# Patient Record
Sex: Female | Born: 2000 | Race: White | Hispanic: No | Marital: Single | Smoking: Never smoker
Health system: Southern US, Community
[De-identification: ages and names within clinical notes are randomized; demographics above are authoritative.]

## PROBLEM LIST (undated history)

## (undated) DIAGNOSIS — G43109 Migraine with aura, not intractable, without status migrainosus: Secondary | ICD-10-CM

## (undated) DIAGNOSIS — G43909 Migraine, unspecified, not intractable, without status migrainosus: Secondary | ICD-10-CM

## (undated) HISTORY — DX: Migraine with aura, not intractable, without status migrainosus: G43.109

## (undated) HISTORY — DX: Migraine, unspecified, not intractable, without status migrainosus: G43.909

---
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
## 2021-03-04 DIAGNOSIS — G950 Syringomyelia and syringobulbia: Secondary | ICD-10-CM | POA: Insufficient documentation

## 2021-03-04 DIAGNOSIS — M542 Cervicalgia: Secondary | ICD-10-CM | POA: Insufficient documentation

## 2021-03-04 DIAGNOSIS — G8929 Other chronic pain: Secondary | ICD-10-CM | POA: Insufficient documentation

## 2021-05-23 ENCOUNTER — Other Ambulatory Visit: Payer: Self-pay

## 2021-05-23 ENCOUNTER — Emergency Department (HOSPITAL_BASED_OUTPATIENT_CLINIC_OR_DEPARTMENT_OTHER)
Admission: EM | Admit: 2021-05-23 | Discharge: 2021-05-23 | Disposition: A | Discharge status: Home / Self Care | Payer: Medicaid Other | Attending: Emergency Medicine | Admitting: Emergency Medicine

## 2021-05-23 ENCOUNTER — Encounter (HOSPITAL_BASED_OUTPATIENT_CLINIC_OR_DEPARTMENT_OTHER): Payer: Self-pay | Admitting: Emergency Medicine

## 2021-05-23 DIAGNOSIS — R0602 Shortness of breath: Secondary | ICD-10-CM | POA: Diagnosis not present

## 2021-05-23 DIAGNOSIS — R42 Dizziness and giddiness: Secondary | ICD-10-CM | POA: Diagnosis not present

## 2021-05-23 DIAGNOSIS — Z20822 Contact with and (suspected) exposure to covid-19: Secondary | ICD-10-CM | POA: Diagnosis not present

## 2021-05-23 DIAGNOSIS — R0981 Nasal congestion: Secondary | ICD-10-CM

## 2021-05-23 LAB — RESP PANEL BY RT-PCR (FLU A&B, COVID) ARPGX2
Influenza A by PCR: NEGATIVE
Influenza B by PCR: NEGATIVE
SARS Coronavirus 2 by RT PCR: NEGATIVE

## 2021-05-23 MED ORDER — OXYMETAZOLINE HCL 0.05 % NA SOLN: 1.0000 | Freq: Two times a day (BID) | NASAL | 0 refills | Status: DC

## 2021-05-23 NOTE — Discharge Instructions (Addendum)
Please use Afrin for the next 3 days.  Do not use Afrin after 3 days.  Please start taking fluticasone nasal spray and cetirizine antihistamine daily for allergies and nasal congestion.  Please keep your appointment with ENT.  You can view your results of the respiratory panel on MyChart.  Please return to the emergency department for worsening symptoms.

## 2021-05-23 NOTE — ED Triage Notes (Signed)
Pt presents with congestion to nose, cant breathe out of her nose. Tried flonase without relief. Pt states "ive been having sinus issues for awhile."No antibiotic use.

## 2021-05-23 NOTE — ED Notes (Signed)
EMT-P provided AVS using Teachback Method. Patient verbalizes understanding of Discharge Instructions. Opportunity for Questioning and Answers were provided by EMT-P. Patient Discharged from ED.  ? ?

## 2021-05-23 NOTE — ED Provider Notes (Signed)
[facility] EMERGENCY DEPT Provider Note   CSN: [id number] Arrival date & time: 05/23/21  1121     History Chief Complaint  Lori Wolf presents with   Shortness of Breath    Lori Wolf is a 21 y.o. female who presents to the emergency department for evaluation of nasal congestion that has been ongoing for years.  Lori Wolf states that this morning Lori Wolf woke up around 3 AM cannot breathe out of Lori Wolf nose which caused Lori Wolf to have profound shortness of breath.  Lori Wolf then took a shower and did some Flonase which briefly improved.  Lori Wolf then got congested again and felt like Lori Wolf is getting short of breath as well and had some dizziness and lightheadedness.  Lori Wolf did not pass out.  No chest pain or shortness of breath of this episode.  Lori Wolf does have an ENT appointment scheduled for next week but wanted to come get evaluated.  Lori Wolf does not have any sore throat, fever, chills, cough, or shortness of breath currently.  Lori Wolf states Lori Wolf is able to breathe out of Lori Wolf mouth normally.   Shortness of Breath     Home Medications Prior to Admission medications   Medication Sig Start Date End Date Taking? Authorizing Provider  amphetamine-dextroamphetamine (ADDERALL) 10 MG tablet Take 10 mg by mouth daily with breakfast.   Yes [provider]  FLUoxetine (PROZAC) 20 MG tablet Take 30 mg by mouth daily.   Yes [provider]  oxymetazoline (AFRIN NASAL SPRAY) 0.05 % nasal spray Place 1 spray into both nostrils 2 (two) times daily. 05/23/21  Yes Meredeth Ide, Ioane Bhola M, PA-C  propranolol (INDERAL) 20 MG tablet Take by mouth 2 (two) times daily.  05/19/19  Yes [provider]  sertraline (ZOLOFT) 50 MG tablet Take 75 mg by mouth daily. 06/11/19  Yes [provider]      Allergies    Lori Wolf has no known allergies.    Review of Systems   Review of Systems  Respiratory:  Positive for shortness of breath.   All other systems reviewed and are negative.  Physical  Exam Updated Vital Signs BP 113/73    Pulse 76    Temp 97.6 F (36.4 C) (Oral)    Resp 12    SpO2 100%  Physical Exam Vitals and nursing note reviewed.  Constitutional:      Appearance: Normal appearance.  HENT:     Head: Normocephalic and atraumatic.     Right Ear: Tympanic membrane, ear canal and external ear normal.     Left Ear: Tympanic membrane, ear canal and external ear normal.     Nose:     Right Turbinates: Enlarged and swollen.     Left Turbinates: Enlarged and swollen.     Right Sinus: No maxillary sinus tenderness or frontal sinus tenderness.     Left Sinus: No maxillary sinus tenderness or frontal sinus tenderness.     Comments: No evidence of purulent drainage from either nasal cavity.    Mouth/Throat:     Mouth: Mucous membranes are moist.     Pharynx: Oropharynx is clear. No pharyngeal swelling or oropharyngeal exudate.     Tonsils: No tonsillar exudate or tonsillar abscesses.  Eyes:     General:        Right eye: No discharge.        Left eye: No discharge.     Conjunctiva/sclera: Conjunctivae normal.  Pulmonary:     Effort: Pulmonary effort is normal.  Skin:  General: Skin is warm and dry.     Findings: No rash.  Neurological:     General: No focal deficit present.     Mental Status: Lori Wolf is alert.  Psychiatric:        Mood and Affect: Mood normal.        Behavior: Behavior normal.    ED Results / Procedures / Treatments   Labs (all labs ordered are listed, but only abnormal results are displayed) Labs Reviewed  RESP PANEL BY RT-PCR (FLU A&B, COVID) ARPGX2    EKG None  Radiology No results found.  Procedures Procedures    Medications Ordered in ED Medications - No data to display  ED Course/ Medical Decision Making/ A&P                           Medical Decision Making  Katiana Ruland is a 21 y.o. female who presents the emergency department with nasal congestion and shortness of breath.  Clinically, Lori Wolf is resting comfortably  in no acute distress.  Lori Wolf was able to answer all my questions in complete sentences without being short of breath.  There is evidence of bilateral edema and erythema over the turbinates and the nose.  Lori Wolf does not take any antihistamines or intranasal steroids on a regular basis.  I will plan to get a COVID and influenza swab to evaluate this is not a viral infection.  I do not think this is bacterial in nature as Lori Wolf is not having purulent drainage or sinus tenderness.  I doubt cardiopulmonary causes at this time.  I will plan to write Lori Wolf prescription for Afrin that Lori Wolf can use for the next 3 days and have Lori Wolf start Zyrtec and fluticasone daily until Lori Wolf follows up with ENT.  Lori Wolf wishes to go home.  Respiratory panel still pending.  I do have a low suspicion this is viral at this time.  They can view the results on MyChart.  They are amendable to this plan.  I instructed them to keep their appointment with ENT and follow-up with them.  Strict turn precautions were given.  Lori Wolf is safe for discharge.  Final Clinical Impression(s) / ED Diagnoses Final diagnoses:  Nasal congestion    Rx / DC Orders ED Discharge Orders          Ordered    oxymetazoline (AFRIN NASAL SPRAY) 0.05 % nasal spray  2 times daily        05/23/21 1301              Honor Loh [place], [place] 05/23/21 1302    Franne Forts, DO 05/24/21 2112

## 2021-06-24 ENCOUNTER — Encounter: Payer: Self-pay | Admitting: *Deleted

## 2021-06-24 ENCOUNTER — Other Ambulatory Visit: Payer: Self-pay | Admitting: *Deleted

## 2021-06-26 ENCOUNTER — Ambulatory Visit (INDEPENDENT_AMBULATORY_CARE_PROVIDER_SITE_OTHER): Payer: Medicaid Other | Admitting: Psychiatry

## 2021-06-26 ENCOUNTER — Encounter: Payer: Self-pay | Admitting: Psychiatry

## 2021-06-26 VITALS — BP 132/82 | HR 104 | Ht 64.0 in | Wt 159.4 lb

## 2021-06-26 DIAGNOSIS — G950 Syringomyelia and syringobulbia: Secondary | ICD-10-CM

## 2021-06-26 DIAGNOSIS — G43709 Chronic migraine without aura, not intractable, without status migrainosus: Secondary | ICD-10-CM | POA: Diagnosis not present

## 2021-06-26 MED ORDER — GABAPENTIN 300 MG PO CAPS: ORAL_CAPSULE | ORAL | 3 refills | Status: DC

## 2021-06-26 NOTE — Progress Notes (Signed)
Referring:  Harvest Forest, MD [address] [place],  [place] [address]  PCP: Harvest Forest, MD  Neurology was asked to evaluate Lori Wolf, a 21 year old for a chief complaint of headaches.  Our recommendations of care will be communicated by shared medical record.    CC:  headaches  HPI:  Medical co-morbidities: anxiety, allergies  The patient presents for evaluation of headaches which began when she was a child. She is currently having migraines 2-3 times per week. They are described as squeezing pressure with associated photophobia, phonophobia, and nausea. They can last 24 hours at a time.  She was prescribed Imitrex but has not taken it due to concerns for serotonin syndrome as she also takes an SSRI. Takes ibuprofen as needed which helps a little bit, but does not resolve her headache.  She reports pain in her mid-back which radiates down her arms. She has also started to experience numbness and paresthesias in her hands and arms. Has difficulty sensing temperature with her right hand. 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 She saw Neurosurgery at [facility] who believed her syrinx was causing her symptoms but did not believe there was a role for surgical intervention at that time. She was started on Lyrica, which she did not find effective. She followed up with NSGY 03/04/21 who did not recommend surgical intervention at that time as her syrinx was stable on repeat imaging 12/19/20.   Takes propranolol as needed for anxiety  Headache History: Onset: childhood Triggers: weather changes Aura: floaters (last 5 minutes) Location: occipital, frontal Quality/Description: band-like squeezing Associated Symptoms:  Photophobia: yes  Phonophobia: yes  Nausea: yes Worse with activity?: yes Duration of headaches: up to 24 hours   Headache days per month: 12 Headache free days per month: 18  Current  Treatment: Abortive ibuprofen  Preventative none  Prior Therapies                                 Fluoxetine  Propranolol 20 mg TID PRN Imitrex 100 mg PRN Lyrica 75 mg BID  LABS: CBC    Component Value Date/Time   WBC 9.5 02/25/2020 1118   RBC 4.72 02/25/2020 1118   HGB 13.3 02/25/2020 1118   HCT 42.5 02/25/2020 1118   PLT 308 02/25/2020 1118   MCV 90.0 02/25/2020 1118   MCH 28.2 02/25/2020 1118   MCHC 31.3 02/25/2020 1118   RDW 14.3 02/25/2020 1118   CMP Latest Ref Rng & Units 02/25/2020  Glucose 70 - 99 mg/dL 267(T)  BUN 6 - 20 mg/dL 12  Creatinine 2.45 - 8.09 mg/dL 9.83(J)  Sodium 825 - 053 mmol/L 137  Potassium 3.5 - 5.1 mmol/L 3.8  Chloride 98 - 111 mmol/L 102  CO2 22 - 32 mmol/L 26  Calcium 8.9 - 10.3 mg/dL 9.3  Total Protein 6.5 - 8.1 g/dL 7.9  Total Bilirubin 0.3 - 1.2 mg/dL 0.7  Alkaline Phos 38 - 126 U/L 66  AST 15 - 41 U/L 22  ALT 0 - 44 U/L 15     IMAGING:  MRI brain 07/28/19: normal brain, small syrinx C6-T1  MRI C and T-spine 12/18/20: 1. 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 2. Small right-sided disc protrusion at T7-8, slightly increased in size as compared to previous exam, now contacting and mildly  flattening the right anterolateral cord. No cord signal changes or significant stenosis. 3. Otherwise normal MRI of the cervical and thoracic spine.  Imaging independently reviewed on June 26, 2021   Current Outpatient Medications on File Prior to Visit  Medication Sig Dispense Refill   ascorbic acid (VITAMIN C) 250 MG CHEW Chew by mouth.     cetirizine (ZYRTEC) 10 MG tablet Take 10 mg by mouth daily.     FLUoxetine (PROZAC) 10 MG capsule Take 10 mg by mouth daily.     FLUoxetine (PROZAC) 20 MG tablet Take 30 mg by mouth daily.     fluticasone (FLONASE ALLERGY RELIEF) 50 MCG/ACT nasal spray Place into both nostrils daily.     Multiple Vitamins-Minerals (ONE-A-DAY VITACRAVES) CHEW Take 2  gummy by mouth daily.     propranolol (INDERAL) 20 MG tablet Take by mouth 2 (two) times daily.      amphetamine-dextroamphetamine (ADDERALL) 10 MG tablet Take 10 mg by mouth daily with breakfast. (Patient not taking: Reported on 06/26/2021)     meloxicam (MOBIC) 7.5 MG tablet Take 7.5 mg by mouth daily. (Patient not taking: Reported on 06/26/2021)     sertraline (ZOLOFT) 50 MG tablet Take 75 mg by mouth daily. (Patient not taking: Reported on 06/26/2021)     No current facility-administered medications on file prior to visit.     Allergies: Allergies  Allergen Reactions   Peanut Allergen Powder-Dnfp     Other reaction(s): GI intolerance    Family History: Migraine or other headaches in the family:  mother used to get headaches Aneurysms in a first degree relative:  no Brain tumors in the family:  no Other neurological illness in the family:   dementia  Past Medical History: Past Medical History:  Diagnosis Date   Migraine with aura     Past Surgical History History reviewed. No pertinent surgical history.  Social History: Social History   Tobacco Use   Smoking status: Never   Smokeless tobacco: Never  Substance Use Topics   Alcohol use: No   Drug use: No    ROS: Negative for fevers, chills. Positive for headaches, numbness. All other systems reviewed and negative unless stated otherwise in HPI.   Physical Exam:   Vital Signs: BP 132/82 Comment: white coat syndrome   Pulse (!) 104    Ht 5\' 4"  (1.626 m)    Wt 159 lb 6.4 oz (72.3 kg)    BMI 27.36 kg/m  GENERAL: well appearing,in no acute distress,alert SKIN:  Color, texture, turgor normal. No rashes or lesions HEAD:  Normocephalic/atraumatic. CV:  RRR RESP: Normal respiratory effort MSK: +tenderness to palpation over bilateral occiput, neck, and shoulders  NEUROLOGICAL: Mental Status: Alert, oriented to person, place and time,Follows commands Cranial Nerves: PERRL, visual fields intact to confrontation,  extraocular movements intact, facial sensation intact, no facial droop or ptosis, hearing grossly intact, no dysarthria Motor: muscle strength 5/5 both upper and lower extremities Reflexes: brisk biceps reflex bilaterally without spread, 3+ patellars with crossed adductors Sensation: decreased sensation to pinprick over bilateral upper arms and forearms, decreased sensation to temperature over right hand and forearm Coordination: Finger-to- nose-finger intact bilaterally,Heel-to-shin intact bilaterally Gait: normal-based   IMPRESSION: 21 year old female with a history of anxiety, allergies, and cervical syrinx who presents for evaluation of headache and syrinx. Her headaches are consistent with chronic migraine. She has been hesitant to try Imitrex due to concerns for serotonin syndrome. Counseled that this risk is low with PRN usage of triptans.  Encouraged her to try a half tab of Imitrex at the onset of her next migraine to see if she tolerates it well. She is currently taking propranolol as needed for anxiety. Discussed that we can consider changing this to daily dosing to help prevent migraines in the future.  Her sensation changes in her upper extremities is likely secondary to her syrinx. She already follows with NSGY, who have determined she is not a candidate for surgery at this time. States this is her most bothersome symptom today. Will start gabapentin for upper extremity pain and paresthesias. This may also help with her headaches.  PLAN: -Start gabapentin 300 mg QHS x1 week, then increase to 300 mg BID x1 week, then 300 mg TID -Encouraged her to take Imitrex at onset of next migraine. She can try a half tab (50 mg) first to see how well she tolerates it -Next steps: consider changing propranolol to daily for headache prevention   I spent a total of 42 minutes chart reviewing and counseling the patient. Headache education was done. Discussed treatment options including preventive and  acute medications. Discussed medication overuse headache and to limit use of acute treatments to no more than 2 days/week or 10 days/month. Discussed medication side effects, adverse reactions and drug interactions. Written educational materials and patient instructions outlining all of the above were given.  Follow-up: 4 months   Genia Harold, MD 06/26/2021   12:05 PM

## 2021-06-26 NOTE — Patient Instructions (Addendum)
Take 1/2 tablet of Imitrex at the first sign of migraine. May repeat a dose in 2 hours if headaches persists ?Start gabapentin. Take 300 mg at bedtime for one week, then increase to 300 mg twice a day for one week, then increase to 300 mg three times a day ?

## 2021-09-22 ENCOUNTER — Telehealth: Payer: Self-pay

## 2021-09-22 NOTE — Telephone Encounter (Signed)
Called patient who stated her FU needs to be changed to video visit or rescheduled. We changed to my chart VV. Patient verbalized understanding, appreciation.

## 2021-09-22 NOTE — Telephone Encounter (Signed)
Patient left a voicemail with our office this afternoon asking for a call to discuss her appointment.  The voicemail is rather garbled and difficult to understand.

## 2021-11-05 NOTE — Progress Notes (Signed)
CC:  headaches  Virtual Visit via Telephone Note  I connected with Lori Wolf on 11/09/21 at  2:30 PM EDT by telephone and verified that I am speaking with the correct person using two identifiers.  Location: Patient: home Provider: office   I discussed the limitations, risks, security and privacy concerns of performing an evaluation and management service by telephone and the availability of in person appointments. I also discussed with the patient that there may be a patient responsible charge related to this service. The patient expressed understanding and agreed to proceed.   Last visit: 06/26/21  Brief HPI: 21 year old female with a history of anxiety, syrinx (C6-T1) who follows in clinic for migraines.  At her last visit she was encouraged to take Imitrex as needed (had not taken it yet due to concern for side effects). Gabapentin was started for paresthesias and headaches.  Interval History: She has been having headaches almost every day. They are constant. Headaches are associated with photophobia, phonophobia, nausea, and floaters. She has a sensation of being stabbed behind her left eye. Does have pain in the back of her head as well. Imitrex has not made a difference. She was unable to tolerate gabapentin due to drowsiness.  Her paresthesias have worsened in her left arm. It has spread to involve all of her fingers on the left hand except for the thumb. She is scheduled for a repeat MRI of her spine in November.  She has started taking Lamictal.  Headache days per month: 30 Headache free days per month: 0  Current Headache Regimen: Preventative: none Abortive: Imitrex 100 mg PRN  Prior Therapies                                  Fluoxetine  Sertraline Lamictal Propranolol 20 mg  Imitrex 100 mg PRN - lack of efficacy Lyrica 75 mg BID Gabapentin 300 mg QHS - drowsiness  Physical Exam:  Vital Signs: GENERAL:  well appearing, in no acute distress, alert   SKIN:  Color, texture, turgor normal. No rashes or lesions HEAD:  Normocephalic/atraumatic.   NEUROLOGICAL: Mental Status: Alert, oriented to person, place and time, Follows commands, and Speech fluent and appropriate. Cranial Nerves: face symmetric, no dysarthria, hearing grossly intact Motor: moves all extremities equally  IMPRESSION: 21 year old female with a history of anxiety, syrinx (C6-T1) who presents for follow up of migraines and paresthesias. She was unable to tolerate gabapentin due to drowsiness. Headaches have worsened since her last visit and are now constant. Discussed preventive medication options. She is already taking antidepressants and propranolol. She would prefer not to increase propranolol due to concerns for shortness of breath. Would prefer to avoid Topamax as she already has bothersome paresthesias which have been worsening over time. Will try to get Emgality approved for migraine prevention. Imitrex has not been effective, will start Maxalt for migraine rescue. Discussed neck PT. She would like to try this, but is not able to fit this in with her current schedule. She will message me for a referral when her schedule is less busy.  PLAN: -Prevention: Start Emgality 120 mg monthly -Rescue: Start Maxalt 10 mg PRN -consider neck PT, she will message for referral when her schedule is less busy   Follow-up: 6 months  I spent a total of 27 minutes on the date of the service. Headache education was done. Discussed treatment options including preventive and  acute medications, and physical therapy. Discussed medication side effects, adverse reactions and drug interactions. Written educational materials and patient instructions outlining all of the above were given.  Ocie Doyne, MD 11/09/21 2:53 PM

## 2021-11-09 ENCOUNTER — Telehealth (INDEPENDENT_AMBULATORY_CARE_PROVIDER_SITE_OTHER): Payer: Medicaid Other | Admitting: Psychiatry

## 2021-11-09 DIAGNOSIS — G43019 Migraine without aura, intractable, without status migrainosus: Secondary | ICD-10-CM

## 2021-11-09 DIAGNOSIS — G950 Syringomyelia and syringobulbia: Secondary | ICD-10-CM | POA: Diagnosis not present

## 2021-11-09 MED ORDER — EMGALITY 120 MG/ML ~~LOC~~ SOAJ: 2.0000 | Freq: Once | SUBCUTANEOUS | 0 refills | Status: AC

## 2021-11-09 MED ORDER — EMGALITY 120 MG/ML ~~LOC~~ SOAJ: 120.0000 mg | SUBCUTANEOUS | 6 refills | Status: DC

## 2021-11-09 MED ORDER — RIZATRIPTAN BENZOATE 10 MG PO TABS: 10.0000 mg | ORAL_TABLET | ORAL | 6 refills | Status: DC | PRN

## 2021-11-09 NOTE — Patient Instructions (Signed)
Plan: Start Emgality 120 mg monthly. We will submit paperwork to your insurance to get this approved. We will let you know if we run into insurance issues  Start Maxalt 10 mg as needed for migraines. Take at the onset of migraine. If headache recurs or does not fully resolve, you may take a second dose after 2 hours. Please avoid taking more than 2 days per week to avoid rebound headaches  Send me a message when you would like a referral for neck PT

## 2021-11-17 ENCOUNTER — Telehealth: Payer: Self-pay

## 2021-11-17 NOTE — Telephone Encounter (Signed)
PA for emgality has been submitted via CMM   (Key: [id number])  Your information has been sent to [facility].

## 2021-11-17 NOTE — Telephone Encounter (Signed)
PA for Emgality has been approved. Approved. This drug has been approved. Approved quantity: 1 each per 30 day(s). You may fill up to a 34 day supply at a retail pharmacy. You may fill up to a 90 day supply for maintenance drugs, please refer to the formulary for details. Please call the pharmacy to process your prescription claim.

## 2021-12-03 ENCOUNTER — Encounter: Payer: Self-pay | Admitting: Psychiatry

## 2021-12-03 DIAGNOSIS — M542 Cervicalgia: Secondary | ICD-10-CM

## 2021-12-15 ENCOUNTER — Ambulatory Visit: Payer: Medicaid Other | Attending: Psychiatry

## 2021-12-15 ENCOUNTER — Other Ambulatory Visit: Payer: Self-pay

## 2021-12-15 DIAGNOSIS — M6281 Muscle weakness (generalized): Secondary | ICD-10-CM | POA: Diagnosis present

## 2021-12-15 DIAGNOSIS — G4486 Cervicogenic headache: Secondary | ICD-10-CM | POA: Diagnosis present

## 2021-12-15 DIAGNOSIS — M542 Cervicalgia: Secondary | ICD-10-CM | POA: Diagnosis present

## 2021-12-15 DIAGNOSIS — M436 Torticollis: Secondary | ICD-10-CM | POA: Insufficient documentation

## 2021-12-15 NOTE — Therapy (Signed)
OUTPATIENT PHYSICAL THERAPY CERVICAL EVALUATION   Patient Name: Lori Wolf MRN: [id number] DOB:[birth year], 21 y.o., female Today's Date: 12/15/2021   PT End of Session - 12/15/21 1528     Visit Number 1    Date for PT Re-Evaluation 02/09/22    Authorization Type [place] MEDICAID [employer]    PT Start Time 1520    PT Stop Time 1610    PT Time Calculation (min) 50 min    Activity Tolerance Patient tolerated treatment well    Behavior During Therapy WFL for tasks assessed/performed             Past Medical History:  Diagnosis Date   Migraine with aura    History reviewed. No pertinent surgical history. 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) 06/19/2019   Numbness and tingling of right arm 06/19/2019    PCP: Harvest Forest, MD  REFERRING PROVIDER: Ocie Doyne, MD   REFERRING DIAG: M54.2 (ICD-10-CM) - Cervicalgia   THERAPY DIAG:  Stiffness of neck - Plan: PT plan of care cert/re-cert  Muscle weakness (generalized) - Plan: PT plan of care cert/re-cert  Cervicalgia - Plan: PT plan of care cert/re-cert  Cervicogenic headache - Plan: PT plan of care cert/re-cert  Rationale for Evaluation and Treatment Rehabilitation  ONSET DATE: 10/24/21  SUBJECTIVE:                                                                                                                                                                                                         SUBJECTIVE STATEMENT: Patient reports headaches since age of 86.  These resolved for several years after treatment with new eyewear.  She lost her mom at age 76 and headaches resurfaced.  She was admitted to ED several times with severe headaches and nausea.  She has taken dramamine for the past few years.  She is 20 now.  She has been evaluated by ENT, neuro, and headache  specialist.  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  She hopes to be able to lessen her pain.  She would like to able to be social and go out and about without having to push through the pain.   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 The syrinx can compress your spinal cord and cause neurological symptoms.  MD notes: She has been having headaches almost every day. They are constant. Headaches are associated with photophobia, phonophobia, nausea, and floaters. She has a sensation of being stabbed behind her left eye. Does have pain in the back of her head as well. Imitrex has not made a difference. She was unable to tolerate gabapentin due to drowsiness.   Her paresthesias have worsened in her left arm. It has spread to involve all of her fingers on the left hand except for the thumb. She is scheduled for a repeat MRI of her spine in November.   She has started taking Lamictal.  PAIN:  Are you having pain? Yes: NPRS scale: 4.5/10 Pain location: headache behind left eye Pain description: aching Aggravating factors: nothing specific Relieving factors: Tylenol or dramamine (emergency meds if desperate)  PRECAUTIONS: Other: Cervical Syrinx  WEIGHT BEARING RESTRICTIONS No  FALLS:  Has patient fallen in last 6 months? No  LIVING ENVIRONMENT: Lives with: lives with an adult companion Lives in: House/apartment  OCCUPATION: student (part time work)  PLOF: Independent, Independent with basic ADLs, Independent with household mobility without device, Independent with community mobility without device, Independent with homemaking with ambulation, Independent with gait, and Independent with transfers  PATIENT GOALS She would like to able to be social and go out and about without having to push through the pain.   OBJECTIVE:   DIAGNOSTIC FINDINGS:  12/18/20 Small benign hemangioma within the T8 vertebral body noted, stable. No other discrete or worrisome osseous lesions abnormal marrow edema.   Cord: Small cervicothoracic syrinx as detailed above. Otherwise, signal intensity within the thoracic spinal cord is normal. Normal cord caliber and morphology. Again seen is a small syrinx extending from C6 through T1, measuring 2 mm in maximal diameter at the level of C7. This is not significantly changed as compared to previous. Signal intensity within the cord is otherwise normal. Underlying normal cord caliber and morphology.  PATIENT SURVEYS:  NDI: 55%  COGNITION: Overall cognitive status: Within functional limits for tasks assessed   SENSATION: Decreased to light touch on left along radial nerve distribution  POSTURE:  Rounded shoulders and fwd head, large chested (34 H) which likely contributes to her cervical pain  PALPATION: Tenderness and significant trigger points bilateral upper traps and levator along with parascapular areas L>R.     CERVICAL ROM:   Active ROM A/PROM (deg) eval  Flexion WNL  Extension WNL  Right lateral flexion WNL  Left lateral flexion WNL  Right rotation WNL  Left rotation WNL   (Blank rows = not tested)  UPPER EXTREMITY ROM:  WFL  UPPER EXTREMITY MMT:  5/5 bilaterally with exception of left scaption with IR and ER  CERVICAL SPECIAL TESTS:  None due to know syrinx   FUNCTIONAL TESTS:  5 times sit to stand: 9.45 sec Timed up and go (TUG): 7.67   TODAY'S TREATMENT:  Initial eval completed and initiated HEP   PATIENT EDUCATION:  Education details: Initiated HEP Person educated: Patient Education method: Programmer, multimedia, Facilities manager, Verbal cues, and Handouts Education comprehension: verbalized understanding, returned demonstration, and verbal cues required   HOME EXERCISE PROGRAM: Access Code: [email or web address] URL: [email or web address] Date: 12/15/2021 Prepared by:  Mikey Kirschner  Exercises - Shoulder extension with resistance - Neutral  - 1 x daily - 7 x weekly - 3 sets - 10 reps - Standing Shoulder Row with Anchored Resistance  - 1 x daily - 7 x weekly - 3 sets - 10 reps - Shoulder External Rotation and Scapular  Retraction with Resistance  - 1 x daily - 7 x weekly - 3 sets - 10 reps - Standing Shoulder Horizontal Abduction with Resistance  - 1 x daily - 7 x weekly - 3 sets - 10 reps  ASSESSMENT:  CLINICAL IMPRESSION: Patient is a 21 y.o. female who was seen today for physical therapy evaluation and treatment for neck pain and headaches. She has confirmed dx of syrinx along with postural issues due to excessive breast tissue.  She presents with myofascial restriction in bilateral upper traps L > R but normal cervical ROM.  This becomes more restricted when her headaches occur.  She has some weakness in the left rotator cuff which may also be contributing to her left sided neck pain.  She would benefit from skilled PT to address postural weakness, myofascial restriction and trigger points, dry needling if patient agrees to reach her goals stated below.     OBJECTIVE IMPAIRMENTS decreased ROM, decreased strength, hypomobility, increased fascial restrictions, increased muscle spasms, impaired flexibility, impaired sensation, postural dysfunction, and pain.   ACTIVITY LIMITATIONS carrying, lifting, sleeping, hygiene/grooming, and caring for others  PARTICIPATION LIMITATIONS: meal prep, cleaning, laundry, interpersonal relationship, driving, shopping, community activity, occupation, and yard work  PERSONAL FACTORS Age, Fitness, Past/current experiences, Time since onset of injury/illness/exacerbation, and 1 comorbidity: Cervical syrinx  are also affecting patient's functional outcome.   REHAB POTENTIAL: Fair patient has experienced chronic headaches and neck pain for a lengthy amount of time due to the syrinx.   CLINICAL DECISION MAKING: Evolving/moderate  complexity  EVALUATION COMPLEXITY: Moderate   GOALS: Goals reviewed with patient? Yes  SHORT TERM GOALS: Target date: 01/12/2022   Patient will be independent with initial HEP  Baseline: na Goal status: INITIAL  2.  Pain report to be no greater than 4/10  Baseline: na Goal status: INITIAL  3.  Patient to report decrease in number of headaches to less than daily Baseline: na Goal status: INITIAL   LONG TERM GOALS: Target date: 02/09/2022  Patient to be independent with advanced HEP  Baseline: na Goal status: INITIAL  2.  Patient to report pain no greater than 2/10  Baseline: na Goal status: INITIAL  3.  Patient to be able to sleep through the night  Baseline: na Goal status: INITIAL  4.  Patient to report headaches reduced to 2 times per week Baseline: na Goal status: INITIAL  5.  Patient to report 75% improvement in overall symptoms Baseline: na Goal status: INITIAL  6.  Patient to discuss possible breast reduction as an option to reduce postural strain with her PCP Baseline: na Goal status: INITIAL   PLAN: PT FREQUENCY: 1-2x/week  PT DURATION: 8 weeks  PLANNED INTERVENTIONS: Therapeutic exercises, Therapeutic activity, Neuromuscular re-education, Balance training, Gait training, Patient/Family education, Self Care, Joint mobilization, Aquatic Therapy, Dry Needling, Electrical stimulation, Cryotherapy, Moist heat, Taping, Ultrasound, Ionotophoresis 4mg /ml Dexamethasone, Manual therapy, and Re-evaluation  PLAN FOR NEXT SESSION: UBE, postural strengthening, dry needling if with certified PT and patient agrees, STM to upper traps and parascapular areas.     B. [place], PT 12/15/21 5:12 PM  [facility] [address] [place], [place] Phone # [phone] Fax [phone]

## 2021-12-18 ENCOUNTER — Ambulatory Visit: Payer: Medicaid Other | Admitting: Rehabilitative and Restorative Service Providers"

## 2021-12-18 ENCOUNTER — Encounter: Payer: Self-pay | Admitting: Rehabilitative and Restorative Service Providers"

## 2021-12-18 DIAGNOSIS — M6281 Muscle weakness (generalized): Secondary | ICD-10-CM

## 2021-12-18 DIAGNOSIS — M436 Torticollis: Secondary | ICD-10-CM | POA: Diagnosis not present

## 2021-12-18 DIAGNOSIS — M542 Cervicalgia: Secondary | ICD-10-CM

## 2021-12-18 DIAGNOSIS — G4486 Cervicogenic headache: Secondary | ICD-10-CM

## 2021-12-18 NOTE — Therapy (Signed)
OUTPATIENT PHYSICAL THERAPY TREATMENT NOTE   Patient Name: Lori Wolf MRN: [id number] DOB:05/25/2000, 21 y.o., female Today's Date: 12/18/2021   PT End of Session - 12/18/21 0939     Visit Number 2    Date for PT Re-Evaluation 02/09/22    Authorization Type [place] MEDICAID [facility]    Authorization Time Period 12/15/21 - 02/13/22    Authorization - Visit Number 2    Authorization - Number of Visits 12    PT Start Time 0933    PT Stop Time 1011    PT Time Calculation (min) 38 min    Activity Tolerance Patient tolerated treatment well    Behavior During Therapy WFL for tasks assessed/performed             Past Medical History:  Diagnosis Date   Migraine with aura    History reviewed. No pertinent surgical history. 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) 06/19/2019   Numbness and tingling of right arm 06/19/2019    PCP: Harvest Forest, MD  REFERRING PROVIDER: Ocie Doyne, MD   REFERRING DIAG: M54.2 (ICD-10-CM) - Cervicalgia   THERAPY DIAG:  Stiffness of neck  Muscle weakness (generalized)  Cervicalgia  Cervicogenic headache  Rationale for Evaluation and Treatment Rehabilitation  ONSET DATE: 10/24/21  SUBJECTIVE:                                                                                                                                                                                                         SUBJECTIVE STATEMENT: Patient reports compliance with HEP, denies current headache, but does have cervical pain.  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 The syrinx can compress your spinal cord and cause neurological symptoms. MD notes: She has been having headaches almost every day.  They are constant. Headaches are associated with photophobia, phonophobia, nausea, and floaters. She has a sensation of being stabbed behind her left eye. Does have pain in the back of her head as well. Imitrex has not made a difference. She was unable to tolerate gabapentin due to drowsiness.   Her paresthesias have worsened in her left arm. It has spread to involve all of her fingers on the left hand except for the thumb. She is scheduled for a repeat MRI of her spine in November.   She has  started taking Lamictal.  PAIN:  Are you having pain? Yes: NPRS scale: 4-5/10 Pain location: headache behind left eye Pain description: aching Aggravating factors: nothing specific Relieving factors: Tylenol or dramamine (emergency meds if desperate)  PRECAUTIONS: Other: Cervical Syrinx  WEIGHT BEARING RESTRICTIONS No  FALLS:  Has patient fallen in last 6 months? No  LIVING ENVIRONMENT: Lives with: lives with an adult companion Lives in: House/apartment  OCCUPATION: student (part time work)  PLOF: Independent, Independent with basic ADLs, Independent with household mobility without device, Independent with community mobility without device, Independent with homemaking with ambulation, Independent with gait, and Independent with transfers  PATIENT GOALS She would like to able to be social and go out and about without having to push through the pain.   OBJECTIVE:   DIAGNOSTIC FINDINGS: 12/18/20 Small benign hemangioma within the T8 vertebral body noted, stable. No other discrete or worrisome osseous lesions abnormal marrow edema.   Cord: Small cervicothoracic syrinx as detailed above. Otherwise, signal intensity within the thoracic spinal cord is normal. Normal cord caliber and morphology. Again seen is a small syrinx extending from C6 through T1, measuring 2 mm in maximal diameter at the level of C7. This is not significantly changed as compared to previous. Signal intensity within the  cord is otherwise normal. Underlying normal cord caliber and morphology.  PATIENT SURVEYS:  NDI: 55%  COGNITION: Overall cognitive status: Within functional limits for tasks assessed   SENSATION: Decreased to light touch on left along radial nerve distribution  POSTURE:  Rounded shoulders and fwd head, large chested (34 H) which likely contributes to her cervical pain  PALPATION: Tenderness and significant trigger points bilateral upper traps and levator along with parascapular areas L>R.     CERVICAL ROM:   Active ROM A/PROM (deg) eval  Flexion WNL  Extension WNL  Right lateral flexion WNL  Left lateral flexion WNL  Right rotation WNL  Left rotation WNL   (Blank rows = not tested)  UPPER EXTREMITY ROM:  WFL  UPPER EXTREMITY MMT:  5/5 bilaterally with exception of left scaption with IR and ER  CERVICAL SPECIAL TESTS:  None due to know syrinx   FUNCTIONAL TESTS:  5 times sit to stand: 9.45 sec Timed up and go (TUG): 7.67   TODAY'S TREATMENT:  12/18/2021: UBE level 1.0 x3 min each direction with PT present to discuss status Standing:  shoulder rows, shoulder extension, shoulder ER.  Red Tband 2x10 each Standing shoulder horizontal abduction with green tband 2x10 Chin tucks into ball on wall 2x10 3 way scapular retraction on wall with blue loop 2x5 Trigger Point Dry-Needling  Treatment instructions: Expect mild to moderate muscle soreness. S/S of pneumothorax if dry needled over a lung field, and to seek immediate medical attention should they occur. Patient verbalized understanding of these instructions and education. Patient Consent Given: Yes Education handout provided: Yes Muscles treated: B suboccipitals, bilateral cervical paraspinals, bilateral upper traps Electrical stimulation performed: No Parameters: N/A Treatment response/outcome: identified trigger points utilizing skilled palpation.  Able to illicit twitch response and muscle elongation.  Upper  trap and levator stretch 2x20 sec bilat Shoulder rolls x20  12/15/2021:  Initial eval completed and initiated HEP   PATIENT EDUCATION:  Education details: Initiated HEP Person educated: Patient Education method: Programmer, multimedia, Facilities manager, Verbal cues, and Handouts Education comprehension: verbalized understanding, returned demonstration, and verbal cues required   HOME EXERCISE PROGRAM: Access Code: [email or web address] URL: [email or web address] Date: 12/18/2021 Prepared by: Reather Laurence  Exercises - Shoulder extension with resistance -  Neutral  - 1 x daily - 7 x weekly - 3 sets - 10 reps - Standing Shoulder Row with Anchored Resistance  - 1 x daily - 7 x weekly - 3 sets - 10 reps - Shoulder External Rotation and Scapular Retraction with Resistance  - 1 x daily - 7 x weekly - 3 sets - 10 reps - Standing Shoulder Horizontal Abduction with Resistance  - 1 x daily - 7 x weekly - 3 sets - 10 reps - Seated Cervical Retraction  - 1 x daily - 7 x weekly - 2 sets - 10 reps - Seated Upper Trapezius Stretch  - 1 x daily - 7 x weekly - 1 sets - 2 reps - 20 sec hold - Seated Levator Scapulae Stretch  - 1 x daily - 7 x weekly - 1 sets - 2 reps - 20 sec hold  Patient Education - Trigger Point Dry Needling  ASSESSMENT:  CLINICAL IMPRESSION: [place] presents to skilled PT stating that she has been doing her HEP and is having some difficult with shoulder ER.  Pt states that she was educated about dry needling last session and is agreeable to treatment.  Following dry needling, pt reports that her pain decreased to 0/10 and she is feeling significantly better.  Provided pt with handout for dry needling and added new exercises to HEP.  Pt continues to require skilled PT to progress to goal related activities.   OBJECTIVE IMPAIRMENTS decreased ROM, decreased strength, hypomobility, increased fascial restrictions, increased muscle spasms, impaired flexibility, impaired sensation, postural  dysfunction, and pain.   ACTIVITY LIMITATIONS carrying, lifting, sleeping, hygiene/grooming, and caring for others  PARTICIPATION LIMITATIONS: meal prep, cleaning, laundry, interpersonal relationship, driving, shopping, community activity, occupation, and yard work  PERSONAL FACTORS Age, Fitness, Past/current experiences, Time since onset of injury/illness/exacerbation, and 1 comorbidity: Cervical syrinx  are also affecting patient's functional outcome.   REHAB POTENTIAL: Fair patient has experienced chronic headaches and neck pain for a lengthy amount of time due to the syrinx.   CLINICAL DECISION MAKING: Evolving/moderate complexity  EVALUATION COMPLEXITY: Moderate   GOALS: Goals reviewed with patient? Yes  SHORT TERM GOALS: Target date: 01/12/2022   Patient will be independent with initial HEP  Baseline: na Goal status: IN PROGRESS  2.  Pain report to be no greater than 4/10  Baseline: na Goal status: INITIAL  3.  Patient to report decrease in number of headaches to less than daily Baseline: na Goal status: INITIAL   LONG TERM GOALS: Target date: 02/09/2022  Patient to be independent with advanced HEP  Baseline: na Goal status: INITIAL  2.  Patient to report pain no greater than 2/10  Baseline: na Goal status: INITIAL  3.  Patient to be able to sleep through the night  Baseline: na Goal status: INITIAL  4.  Patient to report headaches reduced to 2 times per week Baseline: na Goal status: INITIAL  5.  Patient to report 75% improvement in overall symptoms Baseline: na Goal status: INITIAL  6.  Patient to discuss possible breast reduction as an option to reduce postural strain with her PCP Baseline: na Goal status: INITIAL   PLAN: PT FREQUENCY: 1-2x/week  PT DURATION: 8 weeks  PLANNED INTERVENTIONS: Therapeutic exercises, Therapeutic activity, Neuromuscular re-education, Balance training, Gait training, Patient/Family education, Self Care, Joint  mobilization, Aquatic Therapy, Dry Needling, Electrical stimulation, Cryotherapy, Moist heat, Taping, Ultrasound, Ionotophoresis 4mg /ml Dexamethasone, Manual therapy, and Re-evaluation  PLAN FOR NEXT SESSION: Assess response to dry needling,  UBE, postural strengthening, dry needling and/or manual therapy as indicated, STM to upper traps and parascapular areas.     Reather Laurence, PT 12/18/21 10:29 AM   [facility] [address], [address] [place], [place] [address] Phone # [phone] Fax [phone]

## 2021-12-18 NOTE — Patient Instructions (Signed)
Trigger Point Dry Needling ? What is Trigger Point Dry Needling (DN)? o DN is a physical therapy technique used to treat muscle pain and dysfunction.  Specifically, DN helps deactivate muscle trigger points (muscle knots).  o A thin filiform needle is used to penetrate the skin and stimulate the underlying  trigger point. The goal is for a local twitch response (LTR) to occur and for the  trigger point to relax. No medication of any kind is injected during the procedure.  ? What Does Trigger Point Dry Needling Feel Like?  o The procedure feels different for each individual patient. Some patients report  that they do not actually feel the needle enter the skin and overall the process is  not painful. Very mild bleeding may occur. However, many patients feel a deep  cramping in the muscle in which the needle was inserted. This is the local twitch  response.  ? How Will I feel after the treatment? o Soreness is normal, and the onset of soreness may not occur for a few hours.  Typically this soreness does not last longer than two days.  o Bruising is uncommon, however; ice can be used to decrease any possible  bruising.  o In rare cases feeling tired or nauseous after the treatment is normal. In addition,  your symptoms may get worse before they get better, this period will typically not  last longer than 24 hours.  ? What Can I do After My Treatment? o Increase your hydration by drinking more water for the next 24 hours. o You may place ice or heat on the areas treated that have become sore, however,  do not use heat on inflamed or bruised areas. Heat often brings more relief post  needling. o You can continue your regular activities, but vigorous activity is not  recommended initially after the treatment for 24 hours. DN is best combined with other physical therapy such as strengthening, stretching, and other  Therapies.  [facility] [address], [address] [place], [place] [address] Phone # [phone] Fax [phone]  

## 2021-12-22 ENCOUNTER — Other Ambulatory Visit: Payer: Self-pay | Admitting: Psychiatry

## 2021-12-23 ENCOUNTER — Ambulatory Visit: Payer: Medicaid Other

## 2021-12-23 DIAGNOSIS — M436 Torticollis: Secondary | ICD-10-CM

## 2021-12-23 DIAGNOSIS — G4486 Cervicogenic headache: Secondary | ICD-10-CM

## 2021-12-23 DIAGNOSIS — M6281 Muscle weakness (generalized): Secondary | ICD-10-CM

## 2021-12-23 DIAGNOSIS — M542 Cervicalgia: Secondary | ICD-10-CM

## 2021-12-23 NOTE — Therapy (Signed)
OUTPATIENT PHYSICAL THERAPY TREATMENT NOTE   Patient Name: Lori Wolf MRN: [id number] DOB:February 06, 2001, 21 y.o., female Today's Date: 12/23/2021   PT End of Session - 12/23/21 1238     Visit Number 3    Date for PT Re-Evaluation 02/09/22    Authorization Type [place] MEDICAID [facility]    Authorization Time Period 12/15/21 - 02/13/22    Authorization - Visit Number 3    Authorization - Number of Visits 12    PT Start Time 1235    PT Stop Time 1313    PT Time Calculation (min) 38 min    Activity Tolerance Patient tolerated treatment well    Behavior During Therapy WFL for tasks assessed/performed             Past Medical History:  Diagnosis Date   Migraine with aura    History reviewed. No pertinent surgical history. 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) 06/19/2019   Numbness and tingling of right arm 06/19/2019    PCP: Harvest Forest, MD  REFERRING PROVIDER: Ocie Doyne, MD   REFERRING DIAG: M54.2 (ICD-10-CM) - Cervicalgia   THERAPY DIAG:  Stiffness of neck  Muscle weakness (generalized)  Cervicalgia  Cervicogenic headache  Rationale for Evaluation and Treatment Rehabilitation  ONSET DATE: 10/24/21  SUBJECTIVE:                                                                                                                                                                                                         SUBJECTIVE STATEMENT: Patient reports current headache.  Arrives with sunglasses which help with her headaches.  She states that she had good relief of symptoms for approx 2 days after last session.    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 The syrinx can compress your spinal cord and  cause neurological symptoms. MD notes: She has been having headaches almost every day. They are constant. Headaches are associated with photophobia, phonophobia, nausea, and floaters. She has a sensation of being stabbed behind her left eye. Does have pain in the back of her head as well. Imitrex has not made a difference. She was unable to tolerate gabapentin due to drowsiness.   Her paresthesias have worsened in her left arm. It has spread to involve all of her fingers on the left hand except  for the thumb. She is scheduled for a repeat MRI of her spine in November.   She has started taking Lamictal.  PAIN:  Are you having pain? Yes: NPRS scale: 4-5/10 Pain location: headache behind left eye Pain description: aching Aggravating factors: nothing specific Relieving factors: Tylenol or dramamine (emergency meds if desperate)  PRECAUTIONS: Other: Cervical Syrinx  WEIGHT BEARING RESTRICTIONS No  FALLS:  Has patient fallen in last 6 months? No  LIVING ENVIRONMENT: Lives with: lives with an adult companion Lives in: House/apartment  OCCUPATION: student (part time work)  PLOF: Independent, Independent with basic ADLs, Independent with household mobility without device, Independent with community mobility without device, Independent with homemaking with ambulation, Independent with gait, and Independent with transfers  PATIENT GOALS She would like to able to be social and go out and about without having to push through the pain.   OBJECTIVE:   DIAGNOSTIC FINDINGS: 12/18/20 Small benign hemangioma within the T8 vertebral body noted, stable. No other discrete or worrisome osseous lesions abnormal marrow edema.   Cord: Small cervicothoracic syrinx as detailed above. Otherwise, signal intensity within the thoracic spinal cord is normal. Normal cord caliber and morphology. Again seen is a small syrinx extending from C6 through T1, measuring 2 mm in maximal diameter at the level of C7.  This is not significantly changed as compared to previous. Signal intensity within the cord is otherwise normal. Underlying normal cord caliber and morphology.  PATIENT SURVEYS:  NDI: 55%  COGNITION: Overall cognitive status: Within functional limits for tasks assessed   SENSATION: Decreased to light touch on left along radial nerve distribution  POSTURE:  Rounded shoulders and fwd head, large chested (34 H) which likely contributes to her cervical pain  PALPATION: Tenderness and significant trigger points bilateral upper traps and levator along with parascapular areas L>R.     CERVICAL ROM:   Active ROM A/PROM (deg) eval  Flexion WNL  Extension WNL  Right lateral flexion WNL  Left lateral flexion WNL  Right rotation WNL  Left rotation WNL   (Blank rows = not tested)  UPPER EXTREMITY ROM:  WFL  UPPER EXTREMITY MMT:  5/5 bilaterally with exception of left scaption with IR and ER  CERVICAL SPECIAL TESTS:  None due to know syrinx   FUNCTIONAL TESTS:  5 times sit to stand: 9.45 sec Timed up and go (TUG): 7.67   TODAY'S TREATMENT:  12/23/21: UBE 1.0 x 2.5 min each direction Standing shoulder bilateral ER with yellow band 2 x 10 Standing shoulder horizontal abduction green band 2 x 10 Standing shoulder extension and rows green band 2 x 10 Shoulder rolls x 10 (up, back and down) Chin tucks into ball on wall 2x10 (purple ball) 3 way scapular stabilization with blue loop x 10 each UE 4 D ball rolls with red plyo ball x 20 each direction both Manual STM to bilateral paraspinals and upper traps, sub occipital release x 8 min  12/18/2021: UBE level 1.0 x3 min each direction with PT present to discuss status Standing:  shoulder rows, shoulder extension, shoulder ER.  Red Tband 2x10 each Standing shoulder horizontal abduction with green tband 2x10 Chin tucks into ball on wall 2x10 3 way scapular retraction on wall with blue loop 2x5 Trigger Point Dry-Needling   Treatment instructions: Expect mild to moderate muscle soreness. S/S of pneumothorax if dry needled over a lung field, and to seek immediate medical attention should they occur. Patient verbalized understanding of these instructions and education. Patient Consent Given:  Yes Education handout provided: Yes Muscles treated: B suboccipitals, bilateral cervical paraspinals, bilateral upper traps Electrical stimulation performed: No Parameters: N/A Treatment response/outcome: identified trigger points utilizing skilled palpation.  Able to illicit twitch response and muscle elongation.  Upper trap and levator stretch 2x20 sec bilat Shoulder rolls x20  12/15/2021:  Initial eval completed and initiated HEP   PATIENT EDUCATION:  Education details: Initiated HEP Person educated: Patient Education method: Programmer, multimedia, Facilities manager, Verbal cues, and Handouts Education comprehension: verbalized understanding, returned demonstration, and verbal cues required   HOME EXERCISE PROGRAM: Access Code: [email or web address] URL: [email or web address] Date: 12/18/2021 Prepared by: Reather Laurence  Exercises - Shoulder extension with resistance - Neutral  - 1 x daily - 7 x weekly - 3 sets - 10 reps - Standing Shoulder Row with Anchored Resistance  - 1 x daily - 7 x weekly - 3 sets - 10 reps - Shoulder External Rotation and Scapular Retraction with Resistance  - 1 x daily - 7 x weekly - 3 sets - 10 reps - Standing Shoulder Horizontal Abduction with Resistance  - 1 x daily - 7 x weekly - 3 sets - 10 reps - Seated Cervical Retraction  - 1 x daily - 7 x weekly - 2 sets - 10 reps - Seated Upper Trapezius Stretch  - 1 x daily - 7 x weekly - 1 sets - 2 reps - 20 sec hold - Seated Levator Scapulae Stretch  - 1 x daily - 7 x weekly - 1 sets - 2 reps - 20 sec hold  Patient Education - Trigger Point Dry Needling  ASSESSMENT:  CLINICAL IMPRESSION: Charne responded very well to dry needling.  She had approx 2  days of relief.  She had very little reduction in headache after session today.  She is well motivated and likes to challenge herself with regard to resistive exercises.  She was instructed to check with scheduler to be sure she is scheduled with DN certified PT since this was effective.    Pt continues to require skilled PT to progress to goal related activities.   OBJECTIVE IMPAIRMENTS decreased ROM, decreased strength, hypomobility, increased fascial restrictions, increased muscle spasms, impaired flexibility, impaired sensation, postural dysfunction, and pain.   ACTIVITY LIMITATIONS carrying, lifting, sleeping, hygiene/grooming, and caring for others  PARTICIPATION LIMITATIONS: meal prep, cleaning, laundry, interpersonal relationship, driving, shopping, community activity, occupation, and yard work  PERSONAL FACTORS Age, Fitness, Past/current experiences, Time since onset of injury/illness/exacerbation, and 1 comorbidity: Cervical syrinx  are also affecting patient's functional outcome.   REHAB POTENTIAL: Fair patient has experienced chronic headaches and neck pain for a lengthy amount of time due to the syrinx.   CLINICAL DECISION MAKING: Evolving/moderate complexity  EVALUATION COMPLEXITY: Moderate   GOALS: Goals reviewed with patient? Yes  SHORT TERM GOALS: Target date: 01/12/2022   Patient will be independent with initial HEP  Baseline: na Goal status: IN PROGRESS  2.  Pain report to be no greater than 4/10  Baseline: na Goal status: INITIAL  3.  Patient to report decrease in number of headaches to less than daily Baseline: na Goal status: INITIAL   LONG TERM GOALS: Target date: 02/09/2022  Patient to be independent with advanced HEP  Baseline: na Goal status: INITIAL  2.  Patient to report pain no greater than 2/10  Baseline: na Goal status: INITIAL  3.  Patient to be able to sleep through the night  Baseline: na Goal status: INITIAL  4.  Patient to report  headaches reduced  to 2 times per week Baseline: na Goal status: INITIAL  5.  Patient to report 75% improvement in overall symptoms Baseline: na Goal status: INITIAL  6.  Patient to discuss possible breast reduction as an option to reduce postural strain with her PCP Baseline: na Goal status: INITIAL   PLAN: PT FREQUENCY: 1-2x/week  PT DURATION: 8 weeks  PLANNED INTERVENTIONS: Therapeutic exercises, Therapeutic activity, Neuromuscular re-education, Balance training, Gait training, Patient/Family education, Self Care, Joint mobilization, Aquatic Therapy, Dry Needling, Electrical stimulation, Cryotherapy, Moist heat, Taping, Ultrasound, Ionotophoresis 4mg /ml Dexamethasone, Manual therapy, and Re-evaluation  PLAN FOR NEXT SESSION: Assess response to dry needling, UBE, postural strengthening, dry needling and/or manual therapy as indicated, STM to upper traps and parascapular areas.     B. Connor Foxworthy, PT 12/23/21 1:25 PM   [facility] [address], [address] [place], [place] Phone # [phone] Fax [phone]

## 2021-12-30 ENCOUNTER — Ambulatory Visit: Payer: Medicaid Other | Attending: Psychiatry

## 2021-12-30 DIAGNOSIS — M436 Torticollis: Secondary | ICD-10-CM | POA: Insufficient documentation

## 2021-12-30 DIAGNOSIS — M6281 Muscle weakness (generalized): Secondary | ICD-10-CM | POA: Diagnosis present

## 2021-12-30 DIAGNOSIS — M542 Cervicalgia: Secondary | ICD-10-CM | POA: Diagnosis present

## 2021-12-30 DIAGNOSIS — G4486 Cervicogenic headache: Secondary | ICD-10-CM | POA: Diagnosis present

## 2021-12-30 NOTE — Therapy (Signed)
OUTPATIENT PHYSICAL THERAPY TREATMENT NOTE   Patient Name: Lori Wolf MRN: [id number] DOB:26-Mar-2001, 21 y.o., female Today's Date: 12/30/2021   PT End of Session - 12/30/21 1318     Visit Number 4    Date for PT Re-Evaluation 02/09/22    Authorization Type [place] MEDICAID [facility]    Authorization Time Period 12/15/21 - 02/13/22    Authorization - Visit Number 4    Authorization - Number of Visits 12    PT Start Time 1245    PT Stop Time 1315    PT Time Calculation (min) 30 min    Activity Tolerance Patient tolerated treatment well    Behavior During Therapy WFL for tasks assessed/performed              Past Medical History:  Diagnosis Date   Migraine with aura    History reviewed. No pertinent surgical history. 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) 06/19/2019   Numbness and tingling of right arm 06/19/2019    PCP: Harvest Forest, MD  REFERRING PROVIDER: Ocie Doyne, MD   REFERRING DIAG: M54.2 (ICD-10-CM) - Cervicalgia   THERAPY DIAG:  Stiffness of neck  Muscle weakness (generalized)  Cervicalgia  Cervicogenic headache  Rationale for Evaluation and Treatment Rehabilitation  ONSET DATE: 10/24/21  SUBJECTIVE:                                                                                                                                                                                                         SUBJECTIVE STATEMENT: Patient reports no headache.  Just arm symptoms.  Hasn't had headache in 2 days.  She states this is somewhat unheard of for her.  She arrives 15 min late for appt.   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 The syrinx can compress your spinal cord and cause  neurological symptoms. MD notes: She has been having headaches almost every day. They are constant. Headaches are associated with photophobia, phonophobia, nausea, and floaters. She has a sensation of being stabbed behind her left eye. Does have pain in the back of her head as well. Imitrex has not made a difference. She was unable to tolerate gabapentin due to drowsiness.   Her paresthesias have worsened in her left arm. It has spread to involve all of her fingers on the  left hand except for the thumb. She is scheduled for a repeat MRI of her spine in November.   She has started taking Lamictal.  PAIN:  Are you having pain? Yes: NPRS scale: 4-5/10 Pain location: headache behind left eye Pain description: aching Aggravating factors: nothing specific Relieving factors: Tylenol or dramamine (emergency meds if desperate)  PRECAUTIONS: Other: Cervical Syrinx  WEIGHT BEARING RESTRICTIONS No  FALLS:  Has patient fallen in last 6 months? No  LIVING ENVIRONMENT: Lives with: lives with an adult companion Lives in: House/apartment  OCCUPATION: student (part time work)  PLOF: Independent, Independent with basic ADLs, Independent with household mobility without device, Independent with community mobility without device, Independent with homemaking with ambulation, Independent with gait, and Independent with transfers  PATIENT GOALS She would like to able to be social and go out and about without having to push through the pain.   OBJECTIVE:   DIAGNOSTIC FINDINGS: 12/18/20 Small benign hemangioma within the T8 vertebral body noted, stable. No other discrete or worrisome osseous lesions abnormal marrow edema.   Cord: Small cervicothoracic syrinx as detailed above. Otherwise, signal intensity within the thoracic spinal cord is normal. Normal cord caliber and morphology. Again seen is a small syrinx extending from C6 through T1, measuring 2 mm in maximal diameter at the level of C7. This is  not significantly changed as compared to previous. Signal intensity within the cord is otherwise normal. Underlying normal cord caliber and morphology.  PATIENT SURVEYS:  NDI: 55%  COGNITION: Overall cognitive status: Within functional limits for tasks assessed   SENSATION: Decreased to light touch on left along radial nerve distribution  POSTURE:  Rounded shoulders and fwd head, large chested (34 H) which likely contributes to her cervical pain  PALPATION: Tenderness and significant trigger points bilateral upper traps and levator along with parascapular areas L>R.     CERVICAL ROM:   Active ROM A/PROM (deg) eval  Flexion WNL  Extension WNL  Right lateral flexion WNL  Left lateral flexion WNL  Right rotation WNL  Left rotation WNL   (Blank rows = not tested)  UPPER EXTREMITY ROM:  WFL  UPPER EXTREMITY MMT:  5/5 bilaterally with exception of left scaption with IR and ER  CERVICAL SPECIAL TESTS:  None due to know syrinx   FUNCTIONAL TESTS:  5 times sit to stand: 9.45 sec Timed up and go (TUG): 7.67   TODAY'S TREATMENT:  12/30/21 UBE 1.0 x 2.5 min each direction Shoulder rolls x 10 (up, back and down) Chin tucks into ball on wall 2x10 (purple ball) 3 way scapular stabilization with blue loop x 10 each UE 4 D ball rolls with red plyo ball x 20 each direction both TYI's x 10 0#  over blue physio ball Manual STM to bilateral paraspinals and upper traps, sub occipital release x 8 min  12/23/21: UBE 1.0 x 2.5 min each direction Standing shoulder bilateral ER with yellow band 2 x 10 Standing shoulder horizontal abduction green band 2 x 10 Standing shoulder extension and rows green band 2 x 10 Shoulder rolls x 10 (up, back and down) Chin tucks into ball on wall 2x10 (purple ball) 3 way scapular stabilization with blue loop x 10 each UE 4 D ball rolls with red plyo ball x 20 each direction both Manual STM to bilateral paraspinals and upper traps, sub occipital  release x 8 min  12/18/2021: UBE level 1.0 x3 min each direction with PT present to discuss status Standing:  shoulder rows,  shoulder extension, shoulder ER.  Red Tband 2x10 each Standing shoulder horizontal abduction with green tband 2x10 Chin tucks into ball on wall 2x10 3 way scapular retraction on wall with blue loop 2x5 Trigger Point Dry-Needling  Treatment instructions: Expect mild to moderate muscle soreness. S/S of pneumothorax if dry needled over a lung field, and to seek immediate medical attention should they occur. Patient verbalized understanding of these instructions and education. Patient Consent Given: Yes Education handout provided: Yes Muscles treated: B suboccipitals, bilateral cervical paraspinals, bilateral upper traps Electrical stimulation performed: No Parameters: N/A Treatment response/outcome: identified trigger points utilizing skilled palpation.  Able to illicit twitch response and muscle elongation.  Upper trap and levator stretch 2x20 sec bilat Shoulder rolls x20  12/15/2021:  Initial eval completed and initiated HEP   PATIENT EDUCATION:  Education details: Initiated HEP Person educated: Patient Education method: Programmer, multimedia, Facilities manager, Verbal cues, and Handouts Education comprehension: verbalized understanding, returned demonstration, and verbal cues required   HOME EXERCISE PROGRAM: Access Code: [email or web address] URL: [email or web address] Date: 12/18/2021 Prepared by: Reather Laurence  Exercises - Shoulder extension with resistance - Neutral  - 1 x daily - 7 x weekly - 3 sets - 10 reps - Standing Shoulder Row with Anchored Resistance  - 1 x daily - 7 x weekly - 3 sets - 10 reps - Shoulder External Rotation and Scapular Retraction with Resistance  - 1 x daily - 7 x weekly - 3 sets - 10 reps - Standing Shoulder Horizontal Abduction with Resistance  - 1 x daily - 7 x weekly - 3 sets - 10 reps - Seated Cervical Retraction  - 1 x daily - 7 x  weekly - 2 sets - 10 reps - Seated Upper Trapezius Stretch  - 1 x daily - 7 x weekly - 1 sets - 2 reps - 20 sec hold - Seated Levator Scapulae Stretch  - 1 x daily - 7 x weekly - 1 sets - 2 reps - 20 sec hold  Patient Education - Trigger Point Dry Needling  ASSESSMENT:  CLINICAL IMPRESSION: Breleigh tolerated todays session well with exception of TYI's.  She fatigued very easily and needed several rest breaks to complete 10 reps.  She seemed to have more pronounced fatigue in the right shoulder.  She may benefit from right shoulder strengthening and stability.     Pt continues to require skilled PT to progress to goal related activities.   OBJECTIVE IMPAIRMENTS decreased ROM, decreased strength, hypomobility, increased fascial restrictions, increased muscle spasms, impaired flexibility, impaired sensation, postural dysfunction, and pain.   ACTIVITY LIMITATIONS carrying, lifting, sleeping, hygiene/grooming, and caring for others  PARTICIPATION LIMITATIONS: meal prep, cleaning, laundry, interpersonal relationship, driving, shopping, community activity, occupation, and yard work  PERSONAL FACTORS Age, Fitness, Past/current experiences, Time since onset of injury/illness/exacerbation, and 1 comorbidity: Cervical syrinx  are also affecting patient's functional outcome.   REHAB POTENTIAL: Fair patient has experienced chronic headaches and neck pain for a lengthy amount of time due to the syrinx.   CLINICAL DECISION MAKING: Evolving/moderate complexity  EVALUATION COMPLEXITY: Moderate   GOALS: Goals reviewed with patient? Yes  SHORT TERM GOALS: Target date: 01/12/2022   Patient will be independent with initial HEP  Baseline: na Goal status: IN PROGRESS  2.  Pain report to be no greater than 4/10  Baseline: na Goal status: INITIAL  3.  Patient to report decrease in number of headaches to less than daily Baseline: na Goal status: INITIAL   LONG TERM GOALS: Target date:  02/09/2022  Patient to be independent with advanced HEP  Baseline: na Goal status: INITIAL  2.  Patient to report pain no greater than 2/10  Baseline: na Goal status: INITIAL  3.  Patient to be able to sleep through the night  Baseline: na Goal status: INITIAL  4.  Patient to report headaches reduced to 2 times per week Baseline: na Goal status: INITIAL  5.  Patient to report 75% improvement in overall symptoms Baseline: na Goal status: INITIAL  6.  Patient to discuss possible breast reduction as an option to reduce postural strain with her PCP Baseline: na Goal status: INITIAL   PLAN: PT FREQUENCY: 1-2x/week  PT DURATION: 8 weeks  PLANNED INTERVENTIONS: Therapeutic exercises, Therapeutic activity, Neuromuscular re-education, Balance training, Gait training, Patient/Family education, Self Care, Joint mobilization, Aquatic Therapy, Dry Needling, Electrical stimulation, Cryotherapy, Moist heat, Taping, Ultrasound, Ionotophoresis 4mg /ml Dexamethasone, Manual therapy, and Re-evaluation  PLAN FOR NEXT SESSION: Assess response to dry needling, UBE, postural strengthening, dry needling and/or manual therapy as indicated, STM to upper traps and parascapular areas.     B. Alajah Witman, PT 12/30/21 1:19 PM   [facility] [address] [place], [place] Phone # [phone] Fax [phone]

## 2022-01-05 ENCOUNTER — Ambulatory Visit: Payer: Medicaid Other | Admitting: Rehabilitative and Restorative Service Providers"

## 2022-01-07 ENCOUNTER — Ambulatory Visit: Payer: Medicaid Other | Admitting: Rehabilitative and Restorative Service Providers"

## 2022-01-07 ENCOUNTER — Encounter: Payer: Self-pay | Admitting: Rehabilitative and Restorative Service Providers"

## 2022-01-07 DIAGNOSIS — M542 Cervicalgia: Secondary | ICD-10-CM

## 2022-01-07 DIAGNOSIS — M436 Torticollis: Secondary | ICD-10-CM

## 2022-01-07 DIAGNOSIS — G4486 Cervicogenic headache: Secondary | ICD-10-CM

## 2022-01-07 DIAGNOSIS — M6281 Muscle weakness (generalized): Secondary | ICD-10-CM

## 2022-01-07 NOTE — Therapy (Signed)
OUTPATIENT PHYSICAL THERAPY TREATMENT NOTE   Patient Name: Lori Wolf MRN: [id number] DOB:09/18/2000, 21 y.o., female Today's Date: 01/07/2022   PT End of Session - 01/07/22 1149     Visit Number 5    Date for PT Re-Evaluation 02/09/22    Authorization Type [place] MEDICAID [facility]    Authorization Time Period 12/15/21 - 02/13/22    Authorization - Visit Number 5    Authorization - Number of Visits 12    PT Start Time 1145    PT Stop Time 1225    PT Time Calculation (min) 40 min    Activity Tolerance Patient tolerated treatment well    Behavior During Therapy WFL for tasks assessed/performed              Past Medical History:  Diagnosis Date   Migraine with aura    History reviewed. No pertinent surgical history. 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) 06/19/2019   Numbness and tingling of right arm 06/19/2019    PCP: Harvest Forest, MD  REFERRING PROVIDER: Ocie Doyne, MD   REFERRING DIAG: M54.2 (ICD-10-CM) - Cervicalgia   THERAPY DIAG:  Stiffness of neck  Muscle weakness (generalized)  Cervicalgia  Cervicogenic headache  Rationale for Evaluation and Treatment Rehabilitation  ONSET DATE: 10/24/21  SUBJECTIVE:                                                                                                                                                                                                         SUBJECTIVE STATEMENT: Patient reports that she had a bad headache and missed her last appointment.  Pt reports that she had great relief of her headache following dry needling and wants to perform again.  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 The syrinx can compress your spinal cord and  cause neurological symptoms. MD notes: She has been having headaches almost every day. They are constant. Headaches are associated with photophobia, phonophobia, nausea, and floaters. She has a sensation of being stabbed behind her left eye. Does have pain in the back of her head as well. Imitrex has not made a difference. She was unable to tolerate gabapentin due to drowsiness.   Her paresthesias have worsened in her left arm. It has spread to involve all of her fingers on the left hand  except for the thumb. She is scheduled for a repeat MRI of her spine in November.   She has started taking Lamictal.  PAIN:  Are you having pain? Yes: NPRS scale: 5/10 Pain location: headache behind left eye Pain description: aching Aggravating factors: nothing specific Relieving factors: Tylenol or dramamine (emergency meds if desperate)  PRECAUTIONS: Other: Cervical Syrinx  WEIGHT BEARING RESTRICTIONS No  FALLS:  Has patient fallen in last 6 months? No  LIVING ENVIRONMENT: Lives with: lives with an adult companion Lives in: House/apartment  OCCUPATION: student (part time work)  PLOF: Independent, Independent with basic ADLs, Independent with household mobility without device, Independent with community mobility without device, Independent with homemaking with ambulation, Independent with gait, and Independent with transfers  PATIENT GOALS She would like to able to be social and go out and about without having to push through the pain.   OBJECTIVE:   DIAGNOSTIC FINDINGS: 12/18/20 Small benign hemangioma within the T8 vertebral body noted, stable. No other discrete or worrisome osseous lesions abnormal marrow edema.   Cord: Small cervicothoracic syrinx as detailed above. Otherwise, signal intensity within the thoracic spinal cord is normal. Normal cord caliber and morphology. Again seen is a small syrinx extending from C6 through T1, measuring 2 mm in maximal diameter at the level of C7.  This is not significantly changed as compared to previous. Signal intensity within the cord is otherwise normal. Underlying normal cord caliber and morphology.  PATIENT SURVEYS:  NDI: 55%  COGNITION: Overall cognitive status: Within functional limits for tasks assessed   SENSATION: Decreased to light touch on left along radial nerve distribution  POSTURE:  Rounded shoulders and fwd head, large chested (34 H) which likely contributes to her cervical pain  PALPATION: Tenderness and significant trigger points bilateral upper traps and levator along with parascapular areas L>R.     CERVICAL ROM:   Active ROM A/PROM (deg) eval  Flexion WNL  Extension WNL  Right lateral flexion WNL  Left lateral flexion WNL  Right rotation WNL  Left rotation WNL   (Blank rows = not tested)  UPPER EXTREMITY ROM:  WFL  UPPER EXTREMITY MMT:  5/5 bilaterally with exception of left scaption with IR and ER  CERVICAL SPECIAL TESTS:  None due to know syrinx   FUNCTIONAL TESTS:  Eval: 5 times sit to stand: 9.45 sec Timed up and go (TUG): 7.67   TODAY'S TREATMENT:  01/07/2022: UBE level 1.4 x3 min each direction with PT present to discuss status Shoulder rolls x 10 (up, back and down) Cervical SNAGs with towel 2x10 bilat Cervical extension with towel overpressure behind neck.  2x10 Chin tucks into ball 2x10 Seated with ball behind back performing extension 2x10 3 way scapular stabilization with blue loop x 10 each UE Trigger Point Dry-Needling  Treatment instructions: Expect mild to moderate muscle soreness. S/S of pneumothorax if dry needled over a lung field, and to seek immediate medical attention should they occur. Patient verbalized understanding of these instructions and education. Patient Consent Given: Yes Education handout provided: Yes Muscles treated: B suboccipitals, bilateral cervical paraspinals, bilateral upper traps Electrical stimulation performed: No Parameters:  N/A Treatment response/outcome: identified trigger points utilizing skilled palpation.  Able to illicit twitch response and muscle elongation.  Manual therapy:  soft tissue mobilization to cervical paraspinals and upper trap.  Performed suboccipital release bilaterally.   12/30/21 UBE 1.0 x 2.5 min each direction Shoulder rolls x 10 (up, back and down) Chin tucks into ball on wall 2x10 (purple  ball) 3 way scapular stabilization with blue loop x 10 each UE 4 D ball rolls with red plyo ball x 20 each direction both TYI's x 10 0#  over blue physio ball Manual STM to bilateral paraspinals and upper traps, sub occipital release x 8 min  12/23/21: UBE 1.0 x 2.5 min each direction Standing shoulder bilateral ER with yellow band 2 x 10 Standing shoulder horizontal abduction green band 2 x 10 Standing shoulder extension and rows green band 2 x 10 Shoulder rolls x 10 (up, back and down) Chin tucks into ball on wall 2x10 (purple ball) 3 way scapular stabilization with blue loop x 10 each UE 4 D ball rolls with red plyo ball x 20 each direction both Manual STM to bilateral paraspinals and upper traps, sub occipital release x 8 min    PATIENT EDUCATION:  Education details: Initiated HEP Person educated: Patient Education method: Programmer, multimedia, Facilities manager, Verbal cues, and Handouts Education comprehension: verbalized understanding, returned demonstration, and verbal cues required   HOME EXERCISE PROGRAM: Access Code: [email or web address] URL: [email or web address] Date: 01/07/2022 Prepared by: Clydie Braun Jaishaun Mcnab  Exercises - Shoulder extension with resistance - Neutral  - 1 x daily - 7 x weekly - 3 sets - 10 reps - Standing Shoulder Row with Anchored Resistance  - 1 x daily - 7 x weekly - 3 sets - 10 reps - Shoulder External Rotation and Scapular Retraction with Resistance  - 1 x daily - 7 x weekly - 3 sets - 10 reps - Standing Shoulder Horizontal Abduction with Resistance  - 1 x daily - 7 x  weekly - 3 sets - 10 reps - Seated Cervical Retraction  - 1 x daily - 7 x weekly - 2 sets - 10 reps - Seated Upper Trapezius Stretch  - 1 x daily - 7 x weekly - 1 sets - 2 reps - 20 sec hold - Seated Levator Scapulae Stretch  - 1 x daily - 7 x weekly - 1 sets - 2 reps - 20 sec hold - Seated Assisted Cervical Rotation with Towel  - 1 x daily - 7 x weekly - 2 sets - 10 reps - Cervical Extension AROM with Strap  - 1 x daily - 7 x weekly - 2 sets - 10 reps  Patient Education - Trigger Point Dry Needling  ASSESSMENT:  CLINICAL IMPRESSION: Conda continues to progress towards goal related activities.  Pt reports a decrease in pain following dry needling and manual therapy with reports of pain decreasing to 1/10.  Pt states that headache decreased as well, but she did have some pressure behind her right eye.  Offered to dry needle temporal region, but pt declined.  Pt continues to require skilled PT to progress towards goal related activities.   OBJECTIVE IMPAIRMENTS decreased ROM, decreased strength, hypomobility, increased fascial restrictions, increased muscle spasms, impaired flexibility, impaired sensation, postural dysfunction, and pain.   ACTIVITY LIMITATIONS carrying, lifting, sleeping, hygiene/grooming, and caring for others  PARTICIPATION LIMITATIONS: meal prep, cleaning, laundry, interpersonal relationship, driving, shopping, community activity, occupation, and yard work  PERSONAL FACTORS Age, Fitness, Past/current experiences, Time since onset of injury/illness/exacerbation, and 1 comorbidity: Cervical syrinx  are also affecting patient's functional outcome.   REHAB POTENTIAL: Fair patient has experienced chronic headaches and neck pain for a lengthy amount of time due to the syrinx.   CLINICAL DECISION MAKING: Evolving/moderate complexity  EVALUATION COMPLEXITY: Moderate   GOALS: Goals reviewed with patient? Yes  SHORT TERM GOALS: Target date: 01/12/2022  Patient will be  independent with initial HEP  Baseline: na Goal status: IN PROGRESS  2.  Pain report to be no greater than 4/10  Baseline: na Goal status: IN PROGRESS  3.  Patient to report decrease in number of headaches to less than daily Baseline: na Goal status: IN PROGRESS   LONG TERM GOALS: Target date: 02/09/2022  Patient to be independent with advanced HEP  Baseline: na Goal status: INITIAL  2.  Patient to report pain no greater than 2/10  Baseline: na Goal status: INITIAL  3.  Patient to be able to sleep through the night  Baseline: na Goal status: INITIAL  4.  Patient to report headaches reduced to 2 times per week Baseline: na Goal status: INITIAL  5.  Patient to report 75% improvement in overall symptoms Baseline: na Goal status: INITIAL  6.  Patient to discuss possible breast reduction as an option to reduce postural strain with her PCP Baseline: na Goal status: INITIAL   PLAN: PT FREQUENCY: 1-2x/week  PT DURATION: 8 weeks  PLANNED INTERVENTIONS: Therapeutic exercises, Therapeutic activity, Neuromuscular re-education, Balance training, Gait training, Patient/Family education, Self Care, Joint mobilization, Aquatic Therapy, Dry Needling, Electrical stimulation, Cryotherapy, Moist heat, Taping, Ultrasound, Ionotophoresis 4mg /ml Dexamethasone, Manual therapy, and Re-evaluation  PLAN FOR NEXT SESSION: Assess response to dry needling, UBE, postural strengthening, dry needling and/or manual therapy as indicated, STM to upper traps and parascapular areas.     , PT 01/07/22 12:43 PM   [facility] [address] [place], [place] Phone # [phone] Fax [phone]

## 2022-01-12 ENCOUNTER — Ambulatory Visit: Payer: Medicaid Other | Admitting: Rehabilitative and Restorative Service Providers"

## 2022-01-12 ENCOUNTER — Encounter: Payer: Self-pay | Admitting: Rehabilitative and Restorative Service Providers"

## 2022-01-12 DIAGNOSIS — M436 Torticollis: Secondary | ICD-10-CM

## 2022-01-12 DIAGNOSIS — M6281 Muscle weakness (generalized): Secondary | ICD-10-CM

## 2022-01-12 DIAGNOSIS — M542 Cervicalgia: Secondary | ICD-10-CM

## 2022-01-12 DIAGNOSIS — G4486 Cervicogenic headache: Secondary | ICD-10-CM

## 2022-01-12 NOTE — Therapy (Addendum)
OUTPATIENT PHYSICAL THERAPY TREATMENT NOTE AND LATE ENTRY DISCHARGE SUMMARY   Patient Name: Lori Wolf MRN: [id number] DOB:04-30-2000, 21 y.o., female Today's Date: 01/12/2022   PT End of Session - 01/12/22 0820     Visit Number 6    Date for PT Re-Evaluation 02/09/22    Authorization Type [place] MEDICAID [facility]    Authorization Time Period 12/15/21 - 02/13/22    Authorization - Visit Number 6    Authorization - Number of Visits 12    PT Start Time 0813   Pt arrived 13 min late for session   PT Stop Time 0840    PT Time Calculation (min) 27 min    Activity Tolerance Patient tolerated treatment well    Behavior During Therapy WFL for tasks assessed/performed              Past Medical History:  Diagnosis Date   Migraine with aura    History reviewed. No pertinent surgical history. 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 ([place]) 06/19/2019   Numbness and tingling of right arm 06/19/2019    PCP: Audley Hose, MD  REFERRING PROVIDER: Genia Harold, MD   REFERRING DIAG: M54.2 (ICD-10-CM) - Cervicalgia   THERAPY DIAG:  Stiffness of neck  Muscle weakness (generalized)  Cervicalgia  Cervicogenic headache  Rationale for Evaluation and Treatment Rehabilitation  ONSET DATE: 10/24/21  SUBJECTIVE:                                                                                                                                                                                                         SUBJECTIVE STATEMENT: Patient reports continued decreased headaches with dry needling and requests again.  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 The syrinx can compress your spinal cord and cause neurological  symptoms. MD notes: She has been having headaches almost every day. They are constant. Headaches are associated with photophobia, phonophobia, nausea, and floaters. She has a sensation of being stabbed behind her left eye. Does have pain in the back of her head as well. Imitrex has not made a difference. She was unable to tolerate gabapentin due to drowsiness.   Her paresthesias have worsened in her left arm. It has spread to involve all of her fingers on the left hand except for the thumb. She is scheduled for  a repeat MRI of her spine in November.   She has started taking Lamictal.  PAIN:  Are you having pain? Yes: NPRS scale: 5/10 Pain location: headache behind left eye Pain description: aching Aggravating factors: nothing specific Relieving factors: Tylenol or dramamine (emergency meds if desperate)  PRECAUTIONS: Other: Cervical Syrinx  WEIGHT BEARING RESTRICTIONS No  FALLS:  Has patient fallen in last 6 months? No  LIVING ENVIRONMENT: Lives with: lives with an adult companion Lives in: House/apartment  OCCUPATION: student (part time work)  PLOF: Independent, Independent with basic ADLs, Independent with household mobility without device, Independent with community mobility without device, Independent with homemaking with ambulation, Independent with gait, and Independent with transfers  PATIENT GOALS She would like to able to be social and go out and about without having to push through the pain.   OBJECTIVE:   DIAGNOSTIC FINDINGS: 12/18/20 Small benign hemangioma within the T8 vertebral body noted, stable. No other discrete or worrisome osseous lesions abnormal marrow edema.   Cord: Small cervicothoracic syrinx as detailed above. Otherwise, signal intensity within the thoracic spinal cord is normal. Normal cord caliber and morphology. Again seen is a small syrinx extending from C6 through T1, measuring 2 mm in maximal diameter at the level of C7. This is  not significantly changed as compared to previous. Signal intensity within the cord is otherwise normal. Underlying normal cord caliber and morphology.  PATIENT SURVEYS:  NDI: 55%  COGNITION: Overall cognitive status: Within functional limits for tasks assessed   SENSATION: Decreased to light touch on left along radial nerve distribution  POSTURE:  Rounded shoulders and fwd head, large chested (34 H) which likely contributes to her cervical pain  PALPATION: Tenderness and significant trigger points bilateral upper traps and levator along with parascapular areas L>R.     CERVICAL ROM:   Active ROM A/PROM (deg) eval  Flexion WNL  Extension WNL  Right lateral flexion WNL  Left lateral flexion WNL  Right rotation WNL  Left rotation WNL   (Blank rows = not tested)  UPPER EXTREMITY ROM:  WFL  UPPER EXTREMITY MMT:  5/5 bilaterally with exception of left scaption with IR and ER  CERVICAL SPECIAL TESTS:  None due to know syrinx   FUNCTIONAL TESTS:  Eval: 5 times sit to stand: 9.45 sec Timed up and go (TUG): 7.67   TODAY'S TREATMENT:  01/12/2022: UBE level 1.4 x3 min each direction with PT present to discuss status TYI's x 10 0#  over blue physio ball 2x10  3 way scapular stabilization with blue loop x 10 each UE 4 D ball rolls with red plyo ball x 20 each direction both Trigger Point Dry-Needling  Treatment instructions: Expect mild to moderate muscle soreness. S/S of pneumothorax if dry needled over a lung field, and to seek immediate medical attention should they occur. Patient verbalized understanding of these instructions and education. Patient Consent Given: Yes Education handout provided: Yes Muscles treated: B suboccipitals, bilateral cervical paraspinals, bilateral upper traps Electrical stimulation performed: No Parameters: N/A Treatment response/outcome: identified trigger points utilizing skilled palpation.  Able to illicit twitch response and muscle  elongation.     01/07/2022: UBE level 1.4 x3 min each direction with PT present to discuss status Shoulder rolls x 10 (up, back and down) Cervical SNAGs with towel 2x10 bilat Cervical extension with towel overpressure behind neck.  2x10 Chin tucks into ball 2x10 Seated with ball behind back performing extension 2x10 3 way scapular stabilization with blue loop x 10 each  UE Trigger Point Dry-Needling  Treatment instructions: Expect mild to moderate muscle soreness. S/S of pneumothorax if dry needled over a lung field, and to seek immediate medical attention should they occur. Patient verbalized understanding of these instructions and education. Patient Consent Given: Yes Education handout provided: Yes Muscles treated: B suboccipitals, bilateral cervical paraspinals, bilateral upper traps Electrical stimulation performed: No Parameters: N/A Treatment response/outcome: identified trigger points utilizing skilled palpation.  Able to illicit twitch response and muscle elongation.  Manual therapy:  soft tissue mobilization to cervical paraspinals and upper trap.  Performed suboccipital release bilaterally.   12/30/21 UBE 1.0 x 2.5 min each direction Shoulder rolls x 10 (up, back and down) Chin tucks into ball on wall 2x10 (purple ball) 3 way scapular stabilization with blue loop x 10 each UE 4 D ball rolls with red plyo ball x 20 each direction both TYI's x 10 0#  over blue physio ball Manual STM to bilateral paraspinals and upper traps, sub occipital release x 8 min  12/23/21: UBE 1.0 x 2.5 min each direction Standing shoulder bilateral ER with yellow band 2 x 10 Standing shoulder horizontal abduction green band 2 x 10 Standing shoulder extension and rows green band 2 x 10 Shoulder rolls x 10 (up, back and down) Chin tucks into ball on wall 2x10 (purple ball) 3 way scapular stabilization with blue loop x 10 each UE 4 D ball rolls with red plyo ball x 20 each direction both Manual STM  to bilateral paraspinals and upper traps, sub occipital release x 8 min    PATIENT EDUCATION:  Education details: Initiated HEP Person educated: Patient Education method: Consulting civil engineer, Media planner, Verbal cues, and Handouts Education comprehension: verbalized understanding, returned demonstration, and verbal cues required   HOME EXERCISE PROGRAM: Access Code: [email or web address] URL: [email or web address] Date: 01/07/2022 Prepared by: Shelby Dubin Glenford Garis  Exercises - Shoulder extension with resistance - Neutral  - 1 x daily - 7 x weekly - 3 sets - 10 reps - Standing Shoulder Row with Anchored Resistance  - 1 x daily - 7 x weekly - 3 sets - 10 reps - Shoulder External Rotation and Scapular Retraction with Resistance  - 1 x daily - 7 x weekly - 3 sets - 10 reps - Standing Shoulder Horizontal Abduction with Resistance  - 1 x daily - 7 x weekly - 3 sets - 10 reps - Seated Cervical Retraction  - 1 x daily - 7 x weekly - 2 sets - 10 reps - Seated Upper Trapezius Stretch  - 1 x daily - 7 x weekly - 1 sets - 2 reps - 20 sec hold - Seated Levator Scapulae Stretch  - 1 x daily - 7 x weekly - 1 sets - 2 reps - 20 sec hold - Seated Assisted Cervical Rotation with Towel  - 1 x daily - 7 x weekly - 2 sets - 10 reps - Cervical Extension AROM with Strap  - 1 x daily - 7 x weekly - 2 sets - 10 reps  Patient Education - Trigger Point Dry Needling  ASSESSMENT:  CLINICAL IMPRESSION: X[place] continues to progress towards goal related activities and overall decreased headaches with dry needling.  Pt continues to report a decrease in cervical pain and headaches following dry needling.  Pt does report some clicking with prone shoulder exercises, but pain returns to baseline shortly following.  Pt continues to require skilled PT to progress towards goal related activities.   OBJECTIVE IMPAIRMENTS decreased ROM, decreased strength, hypomobility, increased fascial  restrictions, increased muscle spasms,  impaired flexibility, impaired sensation, postural dysfunction, and pain.   ACTIVITY LIMITATIONS carrying, lifting, sleeping, hygiene/grooming, and caring for others  PARTICIPATION LIMITATIONS: meal prep, cleaning, laundry, interpersonal relationship, driving, shopping, community activity, occupation, and yard work  PERSONAL FACTORS Age, Fitness, Past/current experiences, Time since onset of injury/illness/exacerbation, and 1 comorbidity: Cervical syrinx  are also affecting patient's functional outcome.   REHAB POTENTIAL: Fair patient has experienced chronic headaches and neck pain for a lengthy amount of time due to the syrinx.   CLINICAL DECISION MAKING: Evolving/moderate complexity  EVALUATION COMPLEXITY: Moderate   GOALS: Goals reviewed with patient? Yes  SHORT TERM GOALS: Target date: 01/12/2022   Patient will be independent with initial HEP  Baseline: na Goal status: MET  2.  Pain report to be no greater than 4/10  Baseline: na Goal status: IN PROGRESS  3.  Patient to report decrease in number of headaches to less than daily Baseline: na Goal status: IN PROGRESS   LONG TERM GOALS: Target date: 02/09/2022  Patient to be independent with advanced HEP  Baseline: na Goal status: INITIAL  2.  Patient to report pain no greater than 2/10  Baseline: na Goal status: INITIAL  3.  Patient to be able to sleep through the night  Baseline: na Goal status: INITIAL  4.  Patient to report headaches reduced to 2 times per week Baseline: na Goal status: INITIAL  5.  Patient to report 75% improvement in overall symptoms Baseline: na Goal status: INITIAL  6.  Patient to discuss possible breast reduction as an option to reduce postural strain with her PCP Baseline: na Goal status: INITIAL   PLAN: PT FREQUENCY: 1-2x/week  PT DURATION: 8 weeks  PLANNED INTERVENTIONS: Therapeutic exercises, Therapeutic activity, Neuromuscular re-education, Balance training, Gait training,  Patient/Family education, Self Care, Joint mobilization, Aquatic Therapy, Dry Needling, Electrical stimulation, Cryotherapy, Moist heat, Taping, Ultrasound, Ionotophoresis 11m/ml Dexamethasone, Manual therapy, and Re-evaluation  PLAN FOR NEXT SESSION: Assess response to dry needling, UBE, postural strengthening, dry needling and/or manual therapy as indicated, STM to upper traps and parascapular areas.       PHYSICAL THERAPY DISCHARGE SUMMARY  As of 05/05/2022, patient has not returned for any additional visits.  Patient discharged from outpatient PT at this time.  Patient agrees to discharge. Patient goals were partially met. Patient is being discharged due to not returning since the last visit.    SJuel Burrow PT 01/12/22 8:50 AM   [facility] [address] [place] [address]Phone # [phone] [phone]

## 2022-01-15 ENCOUNTER — Ambulatory Visit: Payer: Medicaid Other | Admitting: Rehabilitative and Restorative Service Providers"

## 2022-02-03 ENCOUNTER — Encounter (INDEPENDENT_AMBULATORY_CARE_PROVIDER_SITE_OTHER): Payer: Medicaid Other | Admitting: Psychiatry

## 2022-02-03 DIAGNOSIS — G43019 Migraine without aura, intractable, without status migrainosus: Secondary | ICD-10-CM | POA: Diagnosis not present

## 2022-02-03 MED ORDER — ELETRIPTAN HYDROBROMIDE 40 MG PO TABS: 40.0000 mg | ORAL_TABLET | ORAL | 6 refills | Status: DC | PRN

## 2022-02-03 MED ORDER — AJOVY 225 MG/1.5ML ~~LOC~~ SOAJ: 225.0000 mg | SUBCUTANEOUS | 6 refills | Status: DC

## 2022-02-03 NOTE — Telephone Encounter (Signed)
This is her third injection and she gets headache after. What do you advise ?

## 2022-02-03 NOTE — Telephone Encounter (Signed)

## 2022-02-03 NOTE — Addendum Note (Signed)
Addended by: Genia Harold on: 02/03/2022 04:07 PM   Modules accepted: Orders

## 2022-02-08 ENCOUNTER — Telehealth: Payer: Self-pay | Admitting: *Deleted

## 2022-02-08 ENCOUNTER — Encounter: Payer: Self-pay | Admitting: *Deleted

## 2022-02-08 NOTE — Telephone Encounter (Signed)
Eletriptan This drug has been approved. Approved quantity: 10 tablets per 30 day(s). You  may fill up to a 34 day supply at a retail pharmacy. Start Date 02/03/2022, End Date Until Further Notice.

## 2022-02-08 NOTE — Telephone Encounter (Signed)
Eletriptan PA, Key: [id number], [id number]. Your information has been sent to [facility].

## 2022-02-17 ENCOUNTER — Telehealth: Payer: Self-pay | Admitting: Psychiatry

## 2022-02-17 DIAGNOSIS — G43019 Migraine without aura, intractable, without status migrainosus: Secondary | ICD-10-CM

## 2022-02-17 DIAGNOSIS — G43709 Chronic migraine without aura, not intractable, without status migrainosus: Secondary | ICD-10-CM

## 2022-02-17 MED ORDER — AJOVY 225 MG/1.5ML ~~LOC~~ SOAJ: 225.0000 mg | SUBCUTANEOUS | 6 refills | Status: DC

## 2022-02-17 NOTE — Telephone Encounter (Signed)
Ajovy Rx discontinued at [facility] and sent to [facility] as requested.

## 2022-02-17 NOTE — Telephone Encounter (Signed)
Pt is calling. Requesting a new prescription for medication Fremanezumab-vfrm (AJOVY) 225 MG/1.5ML SOAJ. Be sent to [facility] at [place].

## 2022-03-05 ENCOUNTER — Other Ambulatory Visit: Payer: Self-pay | Admitting: Family Medicine

## 2022-03-05 DIAGNOSIS — G950 Syringomyelia and syringobulbia: Secondary | ICD-10-CM

## 2022-04-01 ENCOUNTER — Other Ambulatory Visit: Payer: Medicaid Other

## 2022-04-29 ENCOUNTER — Other Ambulatory Visit: Payer: Medicaid Other

## 2022-05-15 ENCOUNTER — Other Ambulatory Visit: Payer: Medicaid Other

## 2022-05-25 ENCOUNTER — Ambulatory Visit: Payer: Medicaid Other | Admitting: Psychiatry

## 2022-07-06 ENCOUNTER — Telehealth: Payer: Self-pay | Admitting: Psychiatry

## 2022-07-07 ENCOUNTER — Ambulatory Visit: Payer: Medicaid Other | Admitting: Psychiatry

## 2022-08-03 ENCOUNTER — Encounter: Payer: Self-pay | Admitting: Neurology

## 2022-08-03 ENCOUNTER — Ambulatory Visit: Payer: Medicaid Other | Admitting: Neurology

## 2022-08-03 VITALS — BP 107/77 | HR 95 | Ht 64.0 in | Wt 162.0 lb

## 2022-08-03 DIAGNOSIS — G43109 Migraine with aura, not intractable, without status migrainosus: Secondary | ICD-10-CM | POA: Diagnosis not present

## 2022-08-03 DIAGNOSIS — G43019 Migraine without aura, intractable, without status migrainosus: Secondary | ICD-10-CM

## 2022-08-03 MED ORDER — AJOVY 225 MG/1.5ML ~~LOC~~ SOAJ: 225.0000 mg | SUBCUTANEOUS | 11 refills | Status: DC

## 2022-08-03 NOTE — Progress Notes (Signed)
Patient: Lori Wolf Date of Birth: [birth year]  Reason for Visit: Follow up History from: Patient Primary Neurologist: [facility]  ASSESSMENT AND PLAN 22 y.o. year old female   1.  Chronic migraine headache 2.  History of anxiety 3.  History of syrinx C6/T1 4.  Paresthesias  -Start Ajovy for migraine preventative -Continue Relpax as needed -Urine pregnancy test to meet insurance requirement for CGRP -Discussed pregnancy not recommended with CGRP, recommend cessation 6 months prior to conception -Tried and failed: Propranolol, Lyrica, gabapentin Prozac, Lamictal, Emgality, Imitrex, Maxalt, avoid Topamax due to paresthesias, tried neck PT -Follow-up with ophthalmology for yearly exam -Encouraged to MyChart message for any issues, follow-up in 6 months  HISTORY OF PRESENT ILLNESS: Today 08/03/22 When last seen was started on Emgality and Maxalt.  Tried it for 3 months, reportedly had no benefit to headaches.  Will switch to Ajovy.  Maxalt made her too sleepy.  Switch to Relpax. Migraines are less frequent, has 4 bad migraines a month. Can have more frequent stabbing pain to side of eyes, neck pain, can have floaters. Has seen eye doctor was told vision is normal. Did PT for her neck, dry needling, it helped. Never started Ajovy, had issues filling it. Has not used the Relpax, in her mind not bad enough to take it. Has medication anxiety. Sleeping and Tylenol helps the most, she feels most comfortable with this. Takes propranolol as needed for anxiety. 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 She is not planning pregnancy, is not on birth control. She goes to [employer].   HISTORY  Dr. Delena Bali 11/09/21: Brief HPI: 22 year old female with a history of anxiety, syrinx (C6-T1) who follows in clinic for migraines.   At her last visit she was encouraged to take Imitrex as needed (had not taken it yet due to  concern for side effects). Gabapentin was started for paresthesias and headaches.   Interval History: She has been having headaches almost every day. They are constant. Headaches are associated with photophobia, phonophobia, nausea, and floaters. She has a sensation of being stabbed behind her left eye. Does have pain in the back of her head as well. Imitrex has not made a difference. She was unable to tolerate gabapentin due to drowsiness.   Her paresthesias have worsened in her left arm. It has spread to involve all of her fingers on the left hand except for the thumb. She is scheduled for a repeat MRI of her spine in November.   She has started taking Lamictal.   Headache days per month: 30 Headache free days per month: 0   Current Headache Regimen: Preventative: none Abortive: Imitrex 100 mg PRN   Prior Therapies                                  Fluoxetine  Sertraline Lamictal Propranolol 20 mg  Imitrex 100 mg PRN - lack of efficacy Lyrica 75 mg BID Gabapentin 300 mg QHS - drowsiness  REVIEW OF SYSTEMS: Out of a complete 14 system review of symptoms, the patient complains only of the following symptoms, and all other reviewed systems are negative.  See HPI  ALLERGIES: Allergies  Allergen Reactions   Peanut Allergen Powder-Dnfp     Other reaction(s): GI intolerance   Peanut Butter Flavor Other (See Comments)   Peanut-Containing Drug Products  Diarrhea    HOME MEDICATIONS: Outpatient Medications Prior to Visit  Medication Sig Dispense Refill   cetirizine (ZYRTEC) 10 MG tablet Take 10 mg by mouth daily.     eletriptan (RELPAX) 40 MG tablet Take 40 mg by mouth as needed for migraine or headache. May repeat in 2 hours if headache persists or recurs.     propranolol (INDERAL) 20 MG tablet Take by mouth 2 (two) times daily.      eletriptan (RELPAX) 40 MG tablet Take 1 tablet (40 mg total) by mouth as needed for migraine or headache. May repeat in 2 hours if headache persists  or recurs. 10 tablet 6   FLUoxetine (PROZAC) 10 MG capsule Take 10 mg by mouth daily.     FLUoxetine (PROZAC) 20 MG tablet Take 30 mg by mouth daily.     fluticasone (FLONASE ALLERGY RELIEF) 50 MCG/ACT nasal spray Place into both nostrils daily.     Fremanezumab-vfrm (AJOVY) 225 MG/1.5ML SOAJ Inject 225 mg into the skin every 30 (thirty) days. 1.68 mL 6   lamoTRIgine (LAMICTAL) 100 MG tablet Take 100 mg by mouth daily.     No facility-administered medications prior to visit.    PAST MEDICAL HISTORY: Past Medical History:  Diagnosis Date   Migraine with aura    Migraines     PAST SURGICAL HISTORY: History reviewed. No pertinent surgical history.  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   She attends [employer].   She lives with her aunt and uncle.   She has one brother.   Caffeine-few times a month   Social Determinants of Corporate investment banker Strain: Not on file  Food Insecurity: Not on file  Transportation Needs: Not on file  Physical Activity: Not on file  Stress: Not on file  Social  Connections: Not on file  Intimate Partner Violence: Not on file    PHYSICAL EXAM  Vitals:   08/03/22 1045  BP: 107/77  Pulse: 95  Weight: 162 lb (73.5 kg)  Height: 5\' 4"  (1.626 m)   Body mass index is 27.81 kg/m.  Generalized: Well developed, in no acute distress  Neurological examination  Mentation: Alert oriented to time, place, history taking. Follows all commands speech and language fluent Cranial nerve II-XII: Pupils were equal round reactive to light. Extraocular movements were full, visual field were full on confrontational test. Facial sensation and strength were normal.  Head turning and shoulder shrug  were  normal and symmetric. Motor: The motor testing reveals 5 over 5 strength of all 4 extremities. Good symmetric motor tone is noted throughout.  Sensory: Sensory testing is intact to soft touch on all 4 extremities. No evidence of extinction is noted.  Coordination: Cerebellar testing reveals good finger-nose-finger and heel-to-shin bilaterally.  Gait and station: Gait is normal.  Reflexes: Deep tendon reflexes are symmetric and normal bilaterally.   DIAGNOSTIC DATA (LABS, IMAGING, TESTING) - I reviewed patient records, labs, notes, testing and imaging myself where available.  Lab Results  Component Value Date   WBC 9.5 02/25/2020   HGB 13.3 02/25/2020   HCT 42.5 02/25/2020   MCV 90.0 02/25/2020   PLT 308 02/25/2020      Component Value Date/Time   NA 137 02/25/2020 1118   K 3.8 02/25/2020 1118   CL 102 02/25/2020 1118   CO2 26 02/25/2020 1118   GLUCOSE 102 (H) 02/25/2020 1118   BUN 12 02/25/2020 1118   CREATININE 1.09 (H) 02/25/2020 1118   CALCIUM 9.3 02/25/2020 1118   PROT 7.9 02/25/2020 1118   ALBUMIN 4.3 02/25/2020 1118   AST 22 02/25/2020 1118   ALT 15 02/25/2020 1118   ALKPHOS 66 02/25/2020 1118   BILITOT 0.7 02/25/2020 1118   GFRNONAA >60 02/25/2020 1118   No results found for: "CHOL", "HDL", "LDLCALC", "LDLDIRECT", "TRIG", "CHOLHDL" No  results found for: "HGBA1C" No results found for: "VITAMINB12" No results found for: "TSH"  Margie EgeSarah Shakenya Stoneberg, AGNP-C, DNP 08/03/2022, 11:05 AM [facility] [address], [address] [place], [place] [address] [phone]

## 2022-08-03 NOTE — Patient Instructions (Signed)
We will start Ajovy for migraine prevention  Use Relpax as needed  Give the Ajovy 3-4 months for full benefit See you back in 6 months

## 2022-08-04 ENCOUNTER — Encounter: Payer: Self-pay | Admitting: Neurology

## 2022-08-04 ENCOUNTER — Telehealth: Payer: Self-pay

## 2022-08-04 LAB — PREGNANCY, URINE: Preg Test, Ur: NEGATIVE

## 2022-08-04 NOTE — Telephone Encounter (Signed)
Completed PA for Ajovy via CMM. Sent to [facility] Medicaid. Key: [id number]. Should have a determination within 3-5 business days.

## 2022-08-11 NOTE — Telephone Encounter (Signed)
PA for Ajovy was denied by [facility]: "1. Beneficiary does not have medication over-use headache [facility]); 2. Beneficiaries that are women of childbearing age have had a negative pregnancy test at baseline; 3. Beneficiary has 4 or more migraine days per month for at least 3 months; 4. Beneficiary is utilizing prophylactic intervention modalities (e.g. behavioral therapy, physical therapy, lifestyle modifications)."  Will appeal.

## 2022-08-17 ENCOUNTER — Ambulatory Visit: Payer: Medicaid Other | Admitting: Psychiatry

## 2022-09-01 NOTE — Telephone Encounter (Signed)
I called patient.  She has not responded to our request on her signature for consent for appeal for Ajovy.  She reports that she does not want [place] to appeal Ajovy at this time.  She is switching her care over to [facility].  She asked me to cancel her October appointment.  I asked her to let us know of interim questions or concerns prior to her appointment at [facility].  Patient verbalized understanding.

## 2022-09-08 DIAGNOSIS — F331 Major depressive disorder, recurrent, moderate: Secondary | ICD-10-CM | POA: Insufficient documentation

## 2022-09-08 DIAGNOSIS — F411 Generalized anxiety disorder: Secondary | ICD-10-CM | POA: Insufficient documentation

## 2022-12-08 ENCOUNTER — Encounter: Payer: Medicaid Other | Admitting: Radiology

## 2022-12-28 ENCOUNTER — Other Ambulatory Visit: Payer: Self-pay | Admitting: Family Medicine

## 2022-12-28 DIAGNOSIS — G950 Syringomyelia and syringobulbia: Secondary | ICD-10-CM

## 2023-01-20 ENCOUNTER — Encounter: Payer: Self-pay | Admitting: Family Medicine

## 2023-01-25 ENCOUNTER — Other Ambulatory Visit: Payer: Medicaid Other

## 2023-02-01 ENCOUNTER — Ambulatory Visit: Payer: Medicaid Other | Admitting: Neurology

## 2023-02-13 ENCOUNTER — Other Ambulatory Visit: Payer: Medicaid Other

## 2023-02-22 ENCOUNTER — Ambulatory Visit: Payer: Medicaid Other | Admitting: Neurology

## 2023-02-22 DIAGNOSIS — R790 Abnormal level of blood mineral: Secondary | ICD-10-CM | POA: Insufficient documentation

## 2023-02-22 DIAGNOSIS — R7989 Other specified abnormal findings of blood chemistry: Secondary | ICD-10-CM | POA: Insufficient documentation

## 2023-02-23 ENCOUNTER — Ambulatory Visit
Admission: RE | Admit: 2023-02-23 | Discharge: 2023-02-23 | Disposition: A | Discharge status: Home / Self Care | Payer: Medicaid Other | Source: Ambulatory Visit | Attending: Family Medicine | Admitting: Family Medicine

## 2023-02-23 DIAGNOSIS — G950 Syringomyelia and syringobulbia: Secondary | ICD-10-CM

## 2023-05-02 DIAGNOSIS — M545 Low back pain, unspecified: Secondary | ICD-10-CM | POA: Insufficient documentation

## 2023-05-18 DIAGNOSIS — F3281 Premenstrual dysphoric disorder: Secondary | ICD-10-CM | POA: Insufficient documentation

## 2023-08-04 DIAGNOSIS — M25551 Pain in right hip: Secondary | ICD-10-CM | POA: Insufficient documentation

## 2023-10-24 ENCOUNTER — Ambulatory Visit: Admitting: Physical Therapy

## 2023-10-24 NOTE — Therapy (Incomplete)
 OUTPATIENT PHYSICAL THERAPY LOWER EXTREMITY EVALUATION   Patient Name: Lori Wolf MRN: [id number] DOB:22-Jul-2000, 23 y.o., female Today's Date: 10/24/2023  END OF SESSION:   Past Medical History:  Diagnosis Date   Migraine with aura    Migraines    No past surgical history on file. 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  Rationale for Evaluation and Treatment: Rehabilitation  ONSET DATE: 09/09/2023 (referral)   SUBJECTIVE:   SUBJECTIVE STATEMENT: ***  PERTINENT HISTORY: anxiety, syrinx (C6-T1), migraines  PAIN:  Are you having pain? {OPRCPAIN:27236}  PRECAUTIONS: {Therapy precautions:24002}  RED FLAGS: {PT Red Flags:29287}   WEIGHT BEARING RESTRICTIONS: {Yes ***/No:24003}  FALLS:  Has patient fallen in last 6 months? {fallsyesno:27318}  LIVING ENVIRONMENT: Lives with: {OPRC lives with:25569::lives with their family} Lives in: {Lives in:25570} Stairs: {opstairs:27293} Has following equipment at home: {Assistive devices:23999}  OCCUPATION: ***  PLOF: {PLOF:24004}  PATIENT GOALS: ***  NEXT MD VISIT: ***  OBJECTIVE:  Note: Objective measures were completed at Evaluation unless otherwise noted.  DIAGNOSTIC FINDINGS: ***  PATIENT SURVEYS:  {rehab surveys:24030}  COGNITION: Overall cognitive status: {cognition:24006}     SENSATION: {sensation:27233}  EDEMA:  {edema:24020}  MUSCLE LENGTH: Hamstrings: Right *** deg; Left *** deg Debby test: Right *** deg; Left ***  deg  POSTURE: {posture:25561}  PALPATION: ***  LOWER EXTREMITY ROM:  {AROM/PROM:27142} ROM Right eval Left eval  Hip flexion    Hip extension    Hip abduction    Hip adduction    Hip internal rotation    Hip external rotation    Knee flexion    Knee extension    Ankle dorsiflexion    Ankle plantarflexion    Ankle inversion    Ankle eversion     (Blank rows = not tested)  LOWER EXTREMITY MMT:  MMT Right eval Left eval  Hip flexion    Hip extension    Hip abduction    Hip adduction    Hip internal rotation    Hip external rotation    Knee flexion    Knee extension    Ankle dorsiflexion    Ankle plantarflexion    Ankle inversion    Ankle eversion     (Blank rows = not tested)  LOWER EXTREMITY SPECIAL TESTS:  {LEspecialtests:26242}  FUNCTIONAL TESTS:  {Functional tests:24029}  GAIT: Distance walked: *** Assistive device utilized: {Assistive devices:23999} Level of assistance: {Levels of assistance:24026} Comments: ***  TREATMENT DATE: ***    PATIENT EDUCATION:  Education details: *** Person educated: {Person educated:25204} Education method: {Education Method:25205} Education comprehension: {Education Comprehension:25206}  HOME EXERCISE PROGRAM: ***  ASSESSMENT:  CLINICAL IMPRESSION: Patient is a 23 year old female referred to Neuro OPPT for bilateral leg pain. Pt's PMH is significant for: anxiety, syrinx (C6-T1) and migraines. The following deficits were present during the exam: ***. Based on ***, pt is an incr risk for falls. Pt would benefit from skilled PT to address these impairments and functional limitations to maximize functional mobility independence   OBJECTIVE IMPAIRMENTS: {opptimpairments:25111}.   ACTIVITY LIMITATIONS: {activitylimitations:27494}  PARTICIPATION LIMITATIONS:  {participationrestrictions:25113}  PERSONAL FACTORS: {Personal factors:25162} are also affecting patient's functional outcome.   REHAB POTENTIAL: {rehabpotential:25112}  CLINICAL DECISION MAKING: {clinical decision making:25114}  EVALUATION COMPLEXITY: {Evaluation complexity:25115}   GOALS: Goals reviewed with patient? Yes  SHORT TERM GOALS: Target date: *** *** Baseline: Goal status: INITIAL  2.  *** Baseline:  Goal status: INITIAL  3.  *** Baseline:  Goal status: INITIAL  4.  *** Baseline:  Goal status: INITIAL  5.  *** Baseline:  Goal status: INITIAL  6.  *** Baseline:  Goal status: INITIAL  LONG TERM GOALS: Target date: ***  *** Baseline:  Goal status: INITIAL  2.  *** Baseline:  Goal status: INITIAL  3.  *** Baseline:  Goal status: INITIAL  4.  *** Baseline:  Goal status: INITIAL  5.  *** Baseline:  Goal status: INITIAL  6.  *** Baseline:  Goal status: INITIAL   PLAN:  PT FREQUENCY: {rehab frequency:25116}  PT DURATION: {rehab duration:25117}  PLANNED INTERVENTIONS: {rehab planned interventions:25118::97110-Therapeutic exercises,97530- Therapeutic [phone]- Neuromuscular re-education,97535- Self Rjmz,02859- Manual therapy}  PLAN FOR NEXT SESSION: ***   Donyale Berthold E Valentino Saavedra, PT 10/24/2023, 11:58 AM

## 2023-12-05 ENCOUNTER — Encounter: Payer: Self-pay | Admitting: Physical Therapy

## 2023-12-05 ENCOUNTER — Ambulatory Visit: Admitting: Physical Therapy

## 2023-12-05 DIAGNOSIS — M6281 Muscle weakness (generalized): Secondary | ICD-10-CM

## 2023-12-05 DIAGNOSIS — R2681 Unsteadiness on feet: Secondary | ICD-10-CM

## 2023-12-05 DIAGNOSIS — R2689 Other abnormalities of gait and mobility: Secondary | ICD-10-CM

## 2023-12-05 NOTE — Therapy (Signed)
 [facility] [address] [place], [place], [address] Phone: [phone]   Fax:  [phone]  Patient Details  Name: Lori Wolf MRN: [id number] Date of Birth: 18-Jan-2001 Referring Provider:  Cecilia Kevin MATSU, NP  Encounter Date: 12/05/2023    PT End of Session - 12/05/23 1447     Visit Number 1    Authorization Type [employer]    PT Start Time 1445    PT Stop Time 1509    PT Time Calculation (min) 24 min    Activity Tolerance Patient tolerated treatment well    Behavior During Therapy [facility] for tasks assessed/performed         Pt presents to PT eval without AD. 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 Pt often uses a cane due to leg pain (R>L) that runs up her thigh to her hip and low back and makes ADLs very difficult. 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 Recently sprained her R ankle due to running to an emergency at camp in the rain while wearing Chacos. States her knees often swell up, even after swimming. Swimming is very painful. Denies lightheadedness or dizziness but does report history of tachycardia. Has a hard time going up and down stairs, has to navigate sideways due to knee pain. In the winter, has to walk with a shuffled gait pattern as her knees lock up on me.   Discussed benefits of pilates for hypermobility and importance of working on stability for injury prevention. Pt in agreement to transfer care to Delon Norma, as she is a Paediatric nurse and would be the best PT for her case. Pt in agreement with this. Provided pt w/this PT's contact information if she has any questions.    Ademide Schaberg E Destiny Hagin, PT, DPT 12/05/2023, 3:12 PM  [facility] [address] [place], [place], [address] Phone: [phone]   Fax:   [phone]

## 2023-12-16 ENCOUNTER — Other Ambulatory Visit: Payer: Self-pay | Admitting: Internal Medicine

## 2023-12-16 DIAGNOSIS — N6311 Unspecified lump in the right breast, upper outer quadrant: Secondary | ICD-10-CM

## 2023-12-23 ENCOUNTER — Ambulatory Visit: Attending: Family Medicine | Admitting: Physical Therapy

## 2023-12-23 ENCOUNTER — Encounter: Payer: Self-pay | Admitting: Physical Therapy

## 2023-12-23 DIAGNOSIS — R2681 Unsteadiness on feet: Secondary | ICD-10-CM | POA: Diagnosis present

## 2023-12-23 DIAGNOSIS — M357 Hypermobility syndrome: Secondary | ICD-10-CM | POA: Insufficient documentation

## 2023-12-23 DIAGNOSIS — M2550 Pain in unspecified joint: Secondary | ICD-10-CM | POA: Diagnosis present

## 2023-12-23 NOTE — Therapy (Signed)
 OUTPATIENT PHYSICAL THERAPY HYPERMOBILITY EVAL  Patient Name: Lori Wolf MRN: [id number] DOB:October 23, 2000, 23 y.o., female Today's Date: 12/23/2023  END OF SESSION:  PT End of Session - 12/23/23 1456     Visit Number 1    Number of Visits 16    Date for PT Re-Evaluation 02/17/24    Authorization Type [employer]    Authorization Time Period emailed    Authorization - Visit Number 1    PT Start Time 1145    PT Stop Time 1230    PT Time Calculation (min) 45 min    Activity Tolerance Patient tolerated treatment well    Behavior During Therapy WFL for tasks assessed/performed          Past Medical History:  Diagnosis Date   Migraine with aura    Migraines    History reviewed. No pertinent surgical history. 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) 06/19/2019   Numbness and tingling of right arm 06/19/2019    PCP: Roanna Piedmont MD   REFERRING PROVIDER: Cecilia Credit NP (same practice)   REFERRING DIAG: [phone] (ICD-10-CM) - Pain in right lower leg M79.662 (ICD-10-CM) - Pain in left lower leg  Rationale for Evaluation and Treatment: Rehabilitation  THERAPY DIAG:  Polyarthralgia  Hypermobility syndrome  Unsteadiness on feet  ONSET DATE: chronic   SUBJECTIVE:                                                                                                                                                                                           SUBJECTIVE STATEMENT: Pt presents with cluster of symptoms  of multiple joint pain, swelling, tightness in muscles and joint instability.  She was told she was hypermobile, trying to get to Rheum for RA workup as well.  Her days are variable, often very limited by lower body joint pain.  She reports chief joints affected Rt > L knee and  hips. She has had knee injections, imaging. Patient reports clicking, catching with walking upstairs, has to side step at times.   When it is colder, she has shuffling, stiff legged walk, uses cane often and can't walk long periods. She has fatigue, standing 5-10 min only It is hard to be still.  Times when she has to crouch and shift weight continuously  Has had PT over the years for various injuries.    Has the patient been formally diagnosed with EDS or HSD? NO If yes indicate the  type If no ,does the patient feel they may have EDS or HSD? YES   Patient reports symptoms and/or instability in the following areas:  - Cervical Spine: [] Pain [] Instability [] Limited ROM [] Paresthesia  - Thoracic Spine: [x] Pain [] Instability  - Lumbar Spine: [x] Pain [] Instability [] Frequent locking/giving out  - Shoulder: [] L [] R  [] Subluxation [] Dislocations [] Pain [] Fatigue  - Elbow: [] L [] R  [] Instability [] Hyperextension [x] Pain  - Wrist/Hand: [] L [] R  [] Instability [] Fatigue with use [x] Pain  - Hip: [x] L [x] R  [] Instability [x] Pain [x] Clicking [] Gait deviations  - Knee: [x] L [x] R  [x] Instability [] Hyperextension [x] Pain  - Ankle/Foot: [] L [] R  [] Instability [] Frequent sprains Pain   Does the patient have a diagnosis of any of the following: Fatigue[x]  Brain fog[x]  Lightheadedness[] ? Allergies[]  GI[x]  Slow mobility  Neurodiverse[x]   Additional Notes:  ______________________________________   Patient-reported Beighton Score (if known): _____?__/9  Clinician-assessed Beighton Score (today): _____8__/9     PERTINENT HISTORY:  EDS, scoliosis, murmur see below  Migraine  Unknown family history- grandmom has RA, mom passed when she was 13 from [place]    Syringomyelia  Relevant historical information:  (From neuro PT) Pt presents to PT eval without AD. 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 Pt often uses a  cane due to leg pain (R>L) that runs up her thigh to her hip and low back and makes ADLs very difficult. 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 Recently sprained her R ankle due to running to an emergency at camp in the rain while wearing Chacos. States her knees often swell up, even after swimming. Swimming is very painful. Denies lightheadedness or dizziness but does report history of tachycardia. Has a hard time going up and down stairs, has to navigate sideways due to knee pain. In the winter, has to walk with a shuffled gait pattern as her knees lock up on me.      PAIN:  Are you having pain? Yes: NPRS scale: pain diffuse knee hips, moderate , baseline  Pain location: hips, knees   Pain description: stiffness, Aggravating factors: can be random, begin on cycle , standing, walking, swimming increases swelling Relieving factors: nothing really , has a knee brace min relief, walks with cane   PRECAUTIONS: Other: monitor lower body joints   RED FLAGS: None   WEIGHT BEARING RESTRICTIONS: No  FALLS:  Has patient fallen in last 6 months? 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  She still does weekends throughout the year.   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   OBJECTIVE:  Note: Objective measures were completed at Evaluation unless otherwise noted.  DIAGNOSTIC FINDINGS:  Cervical and Thoracic  MRI  IMPRESSION: 1. Small syrinx spanning the C6-T1 levels, unchanged from the prior examination of 12/18/2020. 2. No significant disc degeneration, disc herniation, spinal canal stenosis or neural foraminal narrowing within  the cervical spine. 3. At T7-T8, there is mild disc degeneration. A small right center disc protrusion (at site of posterior annular fissure) focally effaces the ventral thecal sac, and slightly flattens the right ventrolateral aspect of the spinal cord. However, the dorsal CSF space is maintained within the spinal canal. These findings are unchanged.  CARDIO/ORTHOSTATICS: Baseline RHR 65 prone,   97 sitting  ,  standing 104  Did not check BP as well aymptomatic   PATIENT SURVEYS:  PSFS: THE PATIENT SPECIFIC FUNCTIONAL SCALE  Place score of 0-10 (0 = unable to perform activity and 10 = able to perform activity at the same level as before injury or problem)  Activity Date: 12/23/23    Stand to cook a meal  4    2.Daily chores 4    3.Walk without breaks  2    4.      Total Score 10      Total Score = Sum of activity scores/number of activities  Minimally Detectable Change: 3 points (for single activity); 2 points (for average score)  [facility] (nd). The Patient Specific Functional Scale . Retrieved from [email or web address]   COGNITION: Overall cognitive status: Within functional limits for tasks assessed     SENSATION: WFL sometimes L digits 4-5    POSTURE: rounded shoulders, forward head, and moves during session, shifts weight to Rt UE   PALPATION: Patellar pain with gliding proximally and med/laterally Rt knee > L.   LUMBAR ROM:   AROM eval  Flexion WNL distal shin- limited by hamstrings   Extension WNL   Right lateral flexion WNL  Left lateral flexion WNL  Right rotation WNL  Left rotation WNL    (Blank rows = not tested)  LOWER EXTREMITY ROM:   WFL   Active   Right eval Left eval  Hip flexion Not hyper Not hyper  Hip extension    Hip abduction    Hip adduction    Hip internal rotation    Hip external rotation    Knee flexion    Knee extension +10 +10  Ankle dorsiflexion    Ankle plantarflexion    Ankle inversion    Ankle eversion     (Blank rows = not tested)  LOWER EXTREMITY MMT:    MMT Right eval Left eval  Hip flexion    Hip extension    Hip abduction    Hip adduction    Hip internal rotation    Hip external rotation    Knee flexion 5 5  Knee extension 5 5  Ankle dorsiflexion    Ankle plantarflexion    Ankle inversion    Ankle eversion     (Blank rows = not tested)  LUMBAR SPECIAL TESTS:  None   FUNCTIONAL TESTS:  SLS normal each LE     Beighton Scale Lumbar (_0/1) Knees (_2/2) Elbows (2_/2)  5th digit (2_2) Thumb (_2/2) 8/9 Comment on hips, shoulders     TREATMENT DATE:  [facility] Adult PT Treatment:                                                DATE: 12/23/23 Self Care: Junior tape Rt knee pulling patella medially HSD vs h-EDS Co morbidities  PT and MD recommendations  Symptom mgmt      [employer] HEP  PATIENT EDUCATION:  Education details: see above  Person educated: Patient Education method: Programmer, multimedia, Demonstration, and Handouts Education comprehension: verbalized understanding and emailed needs reinforcement   Major Criteria Beighton Score >4 ?  __8___ 4 or more joints consistent arthralgia > 3 mos ? ____yes__   Minor Criteria Beighton Score 1-3?  ______  Arthralgia in 1-3 joints consistently for 1-3 mos OR back pain > 3 mos?   OR spondylolisthesis/spondylosis?  _______  Subluxation or dislocation in > 1 joint or a single joint 2 or more times? _____n__  3 ore more of the following: epicondylitis, tenosynovitis or bursitis? __interspinal  bursitis____  Habitus with arm span  height> 1.03 OR uper limb segment >lower limb segment ratio < 0.89 OR arachnodactyly? ________  Skin stretches > 4 cm without resistance, striae or abnormal scarring? ____YES____  Dysoopomhic eyelids, myopia, antimongoloid slant? ______  Varicose veins, hernia or prolapse or uterus or rectum? ______  MItral valve prolapse? __murmur_____  Diagnosis requires one or more of the following:  ___x__2 major  _____One major and 2 minor  _____ 4 minor  _____2 minor and a first degree relative who is unequivocally affected       HOME EXERCISE PROGRAM: Access Code: [email or web address] URL: [email or web address] Date: 12/23/2023 Prepared by: Delon Norma  Exercises - Supine Quad Set  - 1 x daily - 7 x weekly - 2 sets - 10 reps - 5 hold - Active Straight Leg Raise with Quad Set  - 1 x daily - 7 x weekly - 2 sets - 10 reps - 5 hold - Long Sitting Hamstring Set  - 1 x daily - 7 x weekly - 2 sets - 10 reps - 5 hold - Single Leg Stance with Support  - 1 x daily - 7 x weekly - 1 sets - 5 reps - 30 hold  ASSESSMENT:  CLINICAL IMPRESSION: Patient is a 23 y.o. female who was seen today for physical therapy evaluation and treatment for knee and hip pain due to likely Hypermobility Syndrome. See above for criteria.  She was referred to Dr. Joane to provide further guidance related to this condition.   OBJECTIVE IMPAIRMENTS: Abnormal gait, decreased activity tolerance, difficulty walking, decreased strength, increased fascial restrictions, increased muscle spasms, impaired flexibility, postural dysfunction, and pain.   ACTIVITY LIMITATIONS: carrying, lifting, bending, sitting, standing, squatting, sleeping, stairs, transfers, bed mobility, dressing, and locomotion level  PARTICIPATION LIMITATIONS: cleaning, laundry, interpersonal relationship, shopping, community activity, and occupation  PERSONAL FACTORS: Past/current experiences, Social background, and 3+  comorbidities: chronic pain, migraines, hypermobility, syrinx are also affecting patient's functional outcome.   REHAB POTENTIAL: Good  CLINICAL DECISION MAKING: Evolving/moderate complexity  EVALUATION COMPLEXITY: Moderate   GOALS: Goals reviewed with patient? Yes  SHORT TERM GOALS: Target date: 01/20/2024  Patient will be able to show independence for initial HEP to include posture, core and hip strength and stability.   Baseline:unknown  Goal status: INITIAL  2.  Patient will be able to complete functional testing and set goal (2 min walk, plank) Baseline: NT  Goal status: INITIAL  3.  Pt will understand posture and joint protection as it pertains to home tasks, ADLs Baseline: needs reinforcement  Goal status: INITIAL  LONG TERM GOALS: Target date: 02/17/2024    Patient will be independent with final HEP upon discharge from PT and report consistent benefit following exercise completion.    Baseline: unknown  Goal status: INITIAL  2.  Patient will be able to improve 2 min walk test distance by  50 feet or more with LRAD and no increase in pain.   Baseline: TBA Goal status: INITIAL  3.  Pt will be able to navigate her stairs forward facing and 1 rail with min increase in knee pain 75% po the time.  Baseline: sidestepping most of the time  Goal status: INITIAL  4.  Pt will note improved ability to stand for baking, cooking for 20 min with no more than 1 rest break Baseline: 5 min frequent rest breaks  Goal status: INITIAL  5.  Pt will be able to perform home tasks (folding laundry) in standing for up to 10 min with min fatigue Baseline: mod fatigue  Goal status: INITIAL  PLAN:  PT FREQUENCY: 1-2x/week  PT DURATION: 8 weeks  PLANNED INTERVENTIONS: 97164- PT Re-evaluation, 97750- Physical Performance Testing, 97110-Therapeutic exercises, 97530- Therapeutic activity, [id number]- Neuromuscular re-education, 97535- Self Care, [id number]- Manual therapy, [phone]- Gait training,  Patient/Family education, Balance training, Stair training, Taping, Cryotherapy, and Moist heat.  PLAN FOR NEXT SESSION: check HEP, try nustep. how was tape? Add in hip strength as tol.    Flo Berroa, PT 12/23/2023, 2:59 PM   Delon Norma, PT 12/23/23 4:00 PM Phone: [phone] Fax: [phone]

## 2024-01-02 ENCOUNTER — Ambulatory Visit
Admission: RE | Admit: 2024-01-02 | Discharge: 2024-01-02 | Disposition: A | Discharge status: Home / Self Care | Source: Ambulatory Visit | Attending: Internal Medicine | Admitting: Internal Medicine

## 2024-01-02 DIAGNOSIS — N6311 Unspecified lump in the right breast, upper outer quadrant: Secondary | ICD-10-CM

## 2024-01-13 ENCOUNTER — Ambulatory Visit: Attending: Family Medicine

## 2024-01-13 DIAGNOSIS — M357 Hypermobility syndrome: Secondary | ICD-10-CM | POA: Diagnosis present

## 2024-01-13 DIAGNOSIS — R2681 Unsteadiness on feet: Secondary | ICD-10-CM | POA: Insufficient documentation

## 2024-01-13 DIAGNOSIS — M2550 Pain in unspecified joint: Secondary | ICD-10-CM | POA: Diagnosis present

## 2024-01-13 NOTE — Therapy (Signed)
 OUTPATIENT PHYSICAL THERAPY TREATMENT  Patient Name: Janat Tabbert MRN: [id number] DOB:05/11/2000, 23 y.o., female Today's Date: 01/13/2024  END OF SESSION:  PT End of Session - 01/13/24 0915     Visit Number 2    Number of Visits 16    Date for Recertification  02/17/24    Authorization Type [facility]    Authorization Time Period Approved 10 visits 12/23/23-02/21/24    Authorization - Visit Number 1    Authorization - Number of Visits 10    PT Start Time 0930    PT Stop Time 1012    PT Time Calculation (min) 42 min    Activity Tolerance Patient tolerated treatment well    Behavior During Therapy WFL for tasks assessed/performed           Past Medical History:  Diagnosis Date   Migraine with aura    Migraines    History reviewed. No pertinent surgical history. 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) 06/19/2019   Numbness and tingling of right arm 06/19/2019    PCP: Roanna Piedmont MD   REFERRING PROVIDER: Cecilia Credit NP (same practice)   REFERRING DIAG: [phone] (ICD-10-CM) - Pain in right lower leg M79.662 (ICD-10-CM) - Pain in left lower leg  Rationale for Evaluation and Treatment: Rehabilitation  THERAPY DIAG:  Polyarthralgia  Hypermobility syndrome  Unsteadiness on feet  ONSET DATE: chronic   SUBJECTIVE:                                                                                                                                                                                           SUBJECTIVE STATEMENT: Pt presents to PT with reports of increased pain in bilateral knees and L posterior hip at 7/10. Has been compliant with initial HEP.   EVAL: Pt presents with cluster of symptoms  of multiple joint pain, swelling, tightness in muscles and joint instability.   She was told she was hypermobile, trying to get to Rheum for RA workup as well.  Her days are variable, often very limited by lower body joint pain.  She reports chief joints affected Rt > L knee and hips. She has had knee injections, imaging. Patient reports clicking, catching with walking upstairs, has to side step at times.   When it is colder, she has shuffling, stiff legged walk, uses cane often and can't walk long periods. She has fatigue, standing 5-10 min only It is hard to be still.  Times when she has to crouch and shift weight continuously  Has had PT over the years for various injuries.    Has the patient been formally diagnosed with EDS or HSD? NO If yes indicate the type If no ,does the patient feel they may have EDS or HSD? YES   Patient reports symptoms and/or instability in the following areas:  - Cervical Spine: [] Pain [] Instability [] Limited ROM [] Paresthesia  - Thoracic Spine: [x] Pain [] Instability  - Lumbar Spine: [x] Pain [] Instability [] Frequent locking/giving out  - Shoulder: [] L [] R  [] Subluxation [] Dislocations [] Pain [] Fatigue  - Elbow: [] L [] R  [] Instability [] Hyperextension [x] Pain  - Wrist/Hand: [] L [] R  [] Instability [] Fatigue with use [x] Pain  - Hip: [x] L [x] R  [] Instability [x] Pain [x] Clicking [] Gait deviations  - Knee: [x] L [x] R  [x] Instability [] Hyperextension [x] Pain  - Ankle/Foot: [] L [] R  [] Instability [] Frequent sprains Pain   Does the patient have a diagnosis of any of the following: Fatigue[x]  Brain fog[x]  Lightheadedness[] ? Allergies[]  GI[x]  Slow mobility  Neurodiverse[x]   Additional Notes:  ______________________________________   Patient-reported Beighton Score (if known): _____?__/9  Clinician-assessed Beighton Score (today): _____8__/9     PERTINENT HISTORY:  EDS, scoliosis, murmur see below  Migraine  Unknown family history- grandmom has RA, mom passed when she was 13 from [place]    Syringomyelia  Relevant historical  information:  (From neuro PT) Pt presents to PT eval without AD. 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 Pt often uses a cane due to leg pain (R>L) that runs up her thigh to her hip and low back and makes ADLs very difficult. 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 Recently sprained her R ankle due to running to an emergency at camp in the rain while wearing Chacos. States her knees often swell up, even after swimming. Swimming is very painful. Denies lightheadedness or dizziness but does report history of tachycardia. Has a hard time going up and down stairs, has to navigate sideways due to knee pain. In the winter, has to walk with a shuffled gait pattern as her knees lock up on me.      PAIN:  Are you having pain?  Yes: NPRS scale: pain diffuse knee hips, moderate , baseline  Pain location: hips, knees   Pain description: stiffness, Aggravating factors: can be random, begin on cycle , standing, walking, swimming increases swelling Relieving factors: nothing really , has a knee brace min relief, walks with cane   PRECAUTIONS: Other: monitor lower body joints   RED FLAGS: None   WEIGHT BEARING RESTRICTIONS: No  FALLS:  Has patient fallen in last 6 months? 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  She still does weekends throughout the year.   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   OBJECTIVE:  Note: Objective measures were completed at Evaluation unless otherwise noted.  DIAGNOSTIC FINDINGS:  Cervical and Thoracic MRI  IMPRESSION: 1. Small syrinx spanning the C6-T1 levels, unchanged from the prior examination of 12/18/2020. 2. No significant disc degeneration, disc herniation, spinal canal stenosis or neural foraminal narrowing within the cervical spine. 3. At T7-T8, there is mild disc degeneration. A small right center disc protrusion (at site of posterior annular fissure) focally effaces the ventral thecal sac, and slightly flattens the right ventrolateral aspect of the spinal cord. However, the dorsal CSF space is maintained within the spinal canal. These findings are unchanged.  CARDIO/ORTHOSTATICS: Baseline RHR 65 prone,   97 sitting  ,  standing 104  Did not check BP as well aymptomatic   PATIENT SURVEYS:  PSFS: THE PATIENT SPECIFIC FUNCTIONAL SCALE  Place score of 0-10 (0 = unable to perform activity and 10 = able to perform activity at the same level as before injury or problem)  Activity Date: 12/23/23    Stand to cook a meal  4    2.Daily chores 4    3.Walk without breaks  2    4.      Total Score 10      Total Score = Sum of activity scores/number of activities  Minimally Detectable Change: 3 points (for single activity); 2 points (for average score)  [facility] (nd). The Patient Specific Functional Scale . Retrieved from [email or web address]   COGNITION: Overall cognitive status: Within functional limits for tasks assessed     SENSATION: WFL sometimes L digits 4-5    POSTURE: rounded shoulders, forward head, and moves during session, shifts weight to Rt UE   PALPATION: Patellar pain with gliding proximally and med/laterally Rt knee > L.   LUMBAR ROM:   AROM eval  Flexion WNL distal shin- limited by hamstrings    Extension WNL   Right lateral flexion WNL  Left lateral flexion WNL  Right rotation WNL  Left rotation WNL    (Blank rows = not tested)  LOWER EXTREMITY ROM:   WFL   Active  Right eval Left eval  Hip flexion Not hyper Not hyper  Hip extension    Hip abduction    Hip adduction    Hip internal rotation    Hip external rotation    Knee flexion    Knee extension +10 +10  Ankle dorsiflexion    Ankle plantarflexion    Ankle inversion    Ankle eversion     (Blank rows = not tested)  LOWER EXTREMITY MMT:    MMT Right eval Left eval  Hip flexion    Hip extension    Hip abduction    Hip adduction    Hip internal rotation    Hip external rotation    Knee flexion 5 5  Knee extension 5 5  Ankle dorsiflexion    Ankle plantarflexion    Ankle inversion    Ankle eversion     (Blank rows = not tested)  LUMBAR SPECIAL TESTS:  None   FUNCTIONAL TESTS:  SLS normal each LE     Beighton Scale Lumbar (_0/1) Knees (_2/2) Elbows (2_/2)  5th digit (2_2) Thumb (_2/2) 8/9 Comment on hips, shoulders     TREATMENT DATE:  [facility] Adult PT Treatment:  DATE: 01/13/24 Supine QS x 10 - 5 hold  Supine SLR 3x10 ea Bridge with RTB 3x8  Hooklying clamshell 2x15 GTB S/L hip abd 3x5 ea SLS 2x30 ea Hooklying PPT x 10 - 5 hold Hooklyling PPT with ball 2x10 - 5 hold  [facility] Adult PT Treatment:                                                DATE: 12/23/23 Self Care: Junior tape Rt knee pulling patella medially HSD vs h-EDS Co morbidities  PT and MD recommendations  Symptom mgmt      [employer] HEP                                                                                                                             PATIENT EDUCATION:  Education details: see above  Person educated: Patient Education method: Programmer, multimedia, Facilities manager, and Handouts Education comprehension: verbalized understanding and emailed needs  reinforcement   Major Criteria Beighton Score >4 ?  __8___ 4 or more joints consistent arthralgia > 3 mos ? ____yes__   Minor Criteria Beighton Score 1-3?  ______  Arthralgia in 1-3 joints consistently for 1-3 mos OR back pain > 3 mos?   OR spondylolisthesis/spondylosis?  _______  Subluxation or dislocation in > 1 joint or a single joint 2 or more times? _____n__  3 ore more of the following: epicondylitis, tenosynovitis or bursitis? __interspinal bursitis____  Habitus with arm span  height> 1.03 OR uper limb segment >lower limb segment ratio < 0.89 OR arachnodactyly? ________  Skin stretches > 4 cm without resistance, striae or abnormal scarring? ____YES____  Dysoopomhic eyelids, myopia, antimongoloid slant? ______  Varicose veins, hernia or prolapse or uterus or rectum? ______  MItral valve prolapse? __murmur_____  Diagnosis requires one or more of the following:  ___x__2 major  _____One major and 2 minor  _____ 4 minor  _____2 minor and a first degree relative who is unequivocally affected       HOME EXERCISE PROGRAM: Access Code: [email or web address] URL: [email or web address] Date: 01/13/2024 Prepared by: Alm Kingdom  Exercises - Supine Quad Set  - 1 x daily - 7 x weekly - 2 sets - 10 reps - 5 hold - Active Straight Leg Raise with Quad Set  - 1 x daily - 7 x weekly - 2 sets - 10 reps - 5 hold - Long Sitting Hamstring Set  - 1 x daily - 7 x weekly - 2 sets - 10 reps - 5 hold - Single Leg Stance with Support  - 1 x daily - 7 x weekly - 1 sets - 5 reps - 30 hold - Supine Bridge with Resistance Band  - 1 x daily - 7 x weekly - 3 sets - 8 reps - red band hold - Hooklying Clamshell with Resistance  -  1 x daily - 7 x weekly - 3 sets - 15 reps - green band hold  ASSESSMENT:  CLINICAL IMPRESSION: Pt was able to complete all prescribed exercises with no adverse effect. Today we focused on improving LE strength and stability along with neutral spine core and hip  strengthening in order to decrease pain. HEP updated for continued progression of strength, especially in quad and proximal hip.  EVAL: Patient is a 23 y.o. female who was seen today for physical therapy evaluation and treatment for knee and hip pain due to likely Hypermobility Syndrome. See above for criteria.  She was referred to Dr. Joane to provide further guidance related to this condition.   OBJECTIVE IMPAIRMENTS: Abnormal gait, decreased activity tolerance, difficulty walking, decreased strength, increased fascial restrictions, increased muscle spasms, impaired flexibility, postural dysfunction, and pain.   ACTIVITY LIMITATIONS: carrying, lifting, bending, sitting, standing, squatting, sleeping, stairs, transfers, bed mobility, dressing, and locomotion level  PARTICIPATION LIMITATIONS: cleaning, laundry, interpersonal relationship, shopping, community activity, and occupation  PERSONAL FACTORS: Past/current experiences, Social background, and 3+ comorbidities: chronic pain, migraines, hypermobility, syrinx are also affecting patient's functional outcome.   REHAB POTENTIAL: Good  CLINICAL DECISION MAKING: Evolving/moderate complexity  EVALUATION COMPLEXITY: Moderate   GOALS: Goals reviewed with patient? Yes  SHORT TERM GOALS: Target date: 01/20/2024  Patient will be able to show independence for initial HEP to include posture, core and hip strength and stability.   Baseline:unknown  Goal status: INITIAL  2.  Patient will be able to complete functional testing and set goal (2 min walk, plank) Baseline: NT  Goal status: INITIAL  3.  Pt will understand posture and joint protection as it pertains to home tasks, ADLs Baseline: needs reinforcement  Goal status: INITIAL  LONG TERM GOALS: Target date: 02/17/2024    Patient will be independent with final HEP upon discharge from PT and report consistent benefit following exercise completion.    Baseline: unknown  Goal status:  INITIAL  2.  Patient will be able to improve 2 min walk test distance by 50 feet or more with LRAD and no increase in pain.   Baseline: TBA Goal status: INITIAL  3.  Pt will be able to navigate her stairs forward facing and 1 rail with min increase in knee pain 75% po the time.  Baseline: sidestepping most of the time  Goal status: INITIAL  4.  Pt will note improved ability to stand for baking, cooking for 20 min with no more than 1 rest break Baseline: 5 min frequent rest breaks  Goal status: INITIAL  5.  Pt will be able to perform home tasks (folding laundry) in standing for up to 10 min with min fatigue Baseline: mod fatigue  Goal status: INITIAL  PLAN:  PT FREQUENCY: 1-2x/week  PT DURATION: 8 weeks  PLANNED INTERVENTIONS: 97164- PT Re-evaluation, 97750- Physical Performance Testing, 97110-Therapeutic exercises, 97530- Therapeutic activity, [id number]- Neuromuscular re-education, 97535- Self Care, [id number]- Manual therapy, [phone]- Gait training, Patient/Family education, Balance training, Stair training, Taping, Cryotherapy, and Moist heat.  PLAN FOR NEXT SESSION: check HEP, try nustep. how was tape? Add in hip strength as tol.    Alm JAYSON Kingdom, PT 01/13/2024, 10:14 AM

## 2024-01-18 ENCOUNTER — Encounter: Payer: Self-pay | Admitting: Physical Therapy

## 2024-01-18 ENCOUNTER — Ambulatory Visit: Admitting: Physical Therapy

## 2024-01-18 DIAGNOSIS — R2681 Unsteadiness on feet: Secondary | ICD-10-CM

## 2024-01-18 DIAGNOSIS — M357 Hypermobility syndrome: Secondary | ICD-10-CM

## 2024-01-18 DIAGNOSIS — M2550 Pain in unspecified joint: Secondary | ICD-10-CM | POA: Diagnosis not present

## 2024-01-18 NOTE — Therapy (Signed)
 OUTPATIENT PHYSICAL THERAPY TREATMENT  Patient Name: Lori Wolf MRN: [id number] DOB:[birth year], 23 y.o., female Today's Date: 01/18/2024  END OF SESSION:  PT End of Session - 01/18/24 1018     Visit Number 3    Number of Visits 16    Date for Recertification  02/17/24    Authorization Type [facility]    Authorization Time Period Approved 10 visits 12/23/23-02/21/24    Authorization - Visit Number 2    Authorization - Number of Visits 10    PT Start Time 1016    PT Stop Time 1056    PT Time Calculation (min) 40 min           Past Medical History:  Diagnosis Date   Migraine with aura    Migraines    History reviewed. No pertinent surgical history. 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) 06/19/2019   Numbness and tingling of right arm 06/19/2019    PCP: Roanna Piedmont MD   REFERRING PROVIDER: Cecilia Credit NP (same practice)   REFERRING DIAG: [phone] (ICD-10-CM) - Pain in right lower leg M79.662 (ICD-10-CM) - Pain in left lower leg  Rationale for Evaluation and Treatment: Rehabilitation  THERAPY DIAG:  Polyarthralgia  Hypermobility syndrome  Unsteadiness on feet  ONSET DATE: chronic   SUBJECTIVE:                                                                                                                                                                                           SUBJECTIVE STATEMENT: Pt presents to PT with reports of increased pain in bilateral knees and bilat posterior hip at baseline today 5/10. Was sore in thighs,,hips  EVAL: Pt presents with cluster of symptoms  of multiple joint pain, swelling, tightness in muscles and joint instability.  She was told she was hypermobile, trying to get to Rheum for RA workup as well.  Her days are variable, often  very limited by lower body joint pain.  She reports chief joints affected Rt > L knee and hips. She has had knee injections, imaging. Patient reports clicking, catching with walking upstairs, has to side step at times.   When it is colder, she has shuffling, stiff legged walk, uses cane often and can't walk long periods. She has fatigue, standing 5-10 min only It is hard to be still.  Times when she has to crouch and shift weight continuously  Has had PT over the years for various  injuries.    Has the patient been formally diagnosed with EDS or HSD? NO If yes indicate the type If no ,does the patient feel they may have EDS or HSD? YES   Patient reports symptoms and/or instability in the following areas:  - Cervical Spine: [] Pain [] Instability [] Limited ROM [] Paresthesia  - Thoracic Spine: [x] Pain [] Instability  - Lumbar Spine: [x] Pain [] Instability [] Frequent locking/giving out  - Shoulder: [] L [] R  [] Subluxation [] Dislocations [] Pain [] Fatigue  - Elbow: [] L [] R  [] Instability [] Hyperextension [x] Pain  - Wrist/Hand: [] L [] R  [] Instability [] Fatigue with use [x] Pain  - Hip: [x] L [x] R  [] Instability [x] Pain [x] Clicking [] Gait deviations  - Knee: [x] L [x] R  [x] Instability [] Hyperextension [x] Pain  - Ankle/Foot: [] L [] R  [] Instability [] Frequent sprains Pain   Does the patient have a diagnosis of any of the following: Fatigue[x]  Brain fog[x]  Lightheadedness[] ? Allergies[]  GI[x]  Slow mobility  Neurodiverse[x]   Additional Notes:  ______________________________________   Patient-reported Beighton Score (if known): _____?__/9  Clinician-assessed Beighton Score (today): _____8__/9     PERTINENT HISTORY:  EDS, scoliosis, murmur see below  Migraine  Unknown family history- grandmom has RA, mom passed when she was 13 from [place]    Syringomyelia  Relevant historical information:  (From neuro PT) Pt presents to PT eval without AD. Pt states she has been diagnosed with  hypermobility and is being worked up for EDS and RA, gets blood work next week. Pt often uses a cane due to leg pain (R>L) that runs up her thigh to her hip and low back and makes ADLs very difficult. 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 Recently sprained her R ankle due to running to an emergency at camp in the rain while wearing Chacos. States her knees often swell up, even after swimming. Swimming is very painful. Denies lightheadedness or dizziness but does report history of tachycardia. Has a hard time going up and down stairs, has to navigate sideways due to knee pain. In the winter, has to walk with a shuffled gait pattern as her knees lock up on me.      PAIN:  Are you having pain?  Yes: NPRS scale: pain diffuse knee hips, moderate , baseline 5/10 Pain location: hips, knees   Pain description: stiffness, Aggravating factors: can be random, begin on cycle , standing, walking, swimming increases swelling Relieving factors: nothing really , has a knee brace min relief, walks with cane   PRECAUTIONS: Other: monitor lower body joints   RED FLAGS: None   WEIGHT BEARING RESTRICTIONS: No  FALLS:  Has patient fallen in last 6 months? 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  She still does weekends throughout the year.   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   OBJECTIVE:  Note: Objective measures were completed  at Evaluation unless otherwise noted.  DIAGNOSTIC FINDINGS:  Cervical and Thoracic MRI  IMPRESSION: 1. Small syrinx spanning the C6-T1 levels, unchanged from  the prior examination of 12/18/2020. 2. No significant disc degeneration, disc herniation, spinal canal stenosis or neural foraminal narrowing within the cervical spine. 3. At T7-T8, there is mild disc degeneration. A small right center disc protrusion (at site of posterior annular fissure) focally effaces the ventral thecal sac, and slightly flattens the right ventrolateral aspect of the spinal cord. However, the dorsal CSF space is maintained within the spinal canal. These findings are unchanged.  CARDIO/ORTHOSTATICS: Baseline RHR 65 prone,   97 sitting  ,  standing 104  Did not check BP as well aymptomatic   PATIENT SURVEYS:  PSFS: THE PATIENT SPECIFIC FUNCTIONAL SCALE  Place score of 0-10 (0 = unable to perform activity and 10 = able to perform activity at the same level as before injury or problem)  Activity Date: 12/23/23    Stand to cook a meal  4    2.Daily chores 4    3.Walk without breaks  2    4.      Total Score 10      Total Score = Sum of activity scores/number of activities  Minimally Detectable Change: 3 points (for single activity); 2 points (for average score)  [facility] (nd). The Patient Specific Functional Scale . Retrieved from [email or web address]   COGNITION: Overall cognitive status: Within functional limits for tasks assessed     SENSATION: WFL sometimes L digits 4-5    POSTURE: rounded shoulders, forward head, and moves during session, shifts weight to Rt UE   PALPATION: Patellar pain with gliding proximally and med/laterally Rt knee > L.   LUMBAR ROM:   AROM eval  Flexion WNL distal shin- limited by hamstrings   Extension WNL   Right lateral flexion WNL  Left lateral flexion WNL  Right rotation WNL  Left  rotation WNL    (Blank rows = not tested)  LOWER EXTREMITY ROM:   WFL   Active  Right eval Left eval  Hip flexion Not hyper Not hyper  Hip extension    Hip abduction    Hip adduction    Hip internal rotation    Hip external rotation    Knee flexion    Knee extension +10 +10  Ankle dorsiflexion    Ankle plantarflexion    Ankle inversion    Ankle eversion     (Blank rows = not tested)  LOWER EXTREMITY MMT:    MMT Right eval Left eval  Hip flexion    Hip extension    Hip abduction    Hip adduction    Hip internal rotation    Hip external rotation    Knee flexion 5 5  Knee extension 5 5  Ankle dorsiflexion    Ankle plantarflexion    Ankle inversion    Ankle eversion     (Blank rows = not tested)  LUMBAR SPECIAL TESTS:  None   FUNCTIONAL TESTS:  SLS normal each LE  01/18/24: : 450 feet  01/18/24: forearm Plank 27 sec     Beighton Scale Lumbar (_0/1) Knees (_2/2) Elbows (2_/2)  5th digit (2_2) Thumb (_2/2) 8/9 Comment on hips, shoulders     TREATMENT DATE:  [facility] Adult PT Treatment:                                                DATE: 01/18/24 Therapeutic Exercise: Nustep L2  UE/LE x 5 minutes  QS x 10 each  SLR 2 x 10  Forearm plank on toes 27 sec 1 Rep Max  Green banded Bridge 10 x 1 HL clam GTB with Ab brace  PPT with ball squeee x 10   Therapeutic Activity: 2 MWT -450 feet, increases right knee pain.  Nustep L2 x 5 minutes for activity tolerance- mild increase in right hip and knee pain    [facility] Adult PT Treatment:                                                DATE: 01/13/24 Supine QS x 10 - 5 hold  Supine SLR 3x10 ea Bridge with RTB 3x8  Hooklying clamshell 2x15 GTB S/L hip abd 3x5 ea SLS 2x30 ea Hooklying PPT x 10 - 5 hold Hooklyling PPT with ball 2x10 - 5 hold  [facility] Adult PT Treatment:                                                DATE: 12/23/23 Self Care: Junior tape Rt knee pulling patella medially HSD vs h-EDS Co  morbidities  PT and MD recommendations  Symptom mgmt      [employer] HEP                                                                                                                             PATIENT EDUCATION:  Education details: see above  Person educated: Patient Education method: Programmer, multimedia, Facilities manager, and Handouts Education comprehension: verbalized understanding and emailed needs reinforcement   Major Criteria Beighton Score >4 ?  __8___ 4 or more joints consistent arthralgia > 3 mos ? ____yes__   Minor Criteria Beighton Score 1-3?  ______  Arthralgia in 1-3 joints consistently for 1-3 mos OR back pain > 3 mos?   OR spondylolisthesis/spondylosis?  _______  Subluxation or dislocation in > 1 joint or a single joint 2 or more times? _____n__  3 ore more of the following: epicondylitis, tenosynovitis or bursitis? __interspinal bursitis____  Habitus with arm span  height> 1.03 OR uper limb segment >lower limb segment ratio < 0.89 OR arachnodactyly? ________  Skin stretches > 4 cm without resistance, striae or abnormal scarring? ____YES____  Dysoopomhic eyelids, myopia, antimongoloid slant? ______  Varicose veins, hernia or prolapse or uterus or rectum? ______  MItral valve prolapse? __murmur_____  Diagnosis requires one or more of the following:  ___x__2 major  _____One major and 2 minor  _____ 4 minor  _____2 minor and a first degree relative who is unequivocally affected       HOME EXERCISE PROGRAM: Access Code: [email or web address] URL: [email or web address] Date: 01/13/2024 Prepared by: Alm Kingdom  Exercises -  Supine Quad Set  - 1 x daily - 7 x weekly - 2 sets - 10 reps - 5 hold - Active Straight Leg Raise with Quad Set  - 1 x daily - 7 x weekly - 2 sets - 10 reps - 5 hold - Long Sitting Hamstring Set  - 1 x daily - 7 x weekly - 2 sets - 10 reps - 5 hold - Single Leg Stance with Support  - 1 x daily - 7 x weekly - 1 sets - 5 reps - 30 hold -  Supine Bridge with Resistance Band  - 1 x daily - 7 x weekly - 3 sets - 8 reps - red band hold - Hooklying Clamshell with Resistance  - 1 x daily - 7 x weekly - 3 sets - 15 reps - green band hold Added 01/18/24 - Standard Plank  - 1 x daily - 7 x weekly - 1-3 reps - 10-30 hold  ASSESSMENT:  CLINICAL IMPRESSION: Pt reports she was sore in hips, thighs and knees after last session. At her baseline pain today, had difficulty walking over last couple of days due to pain from the back down. Captured 2 MWT and plank baselines today. Pt was able to complete all prescribed exercises with no adverse effect. Today we focused on improving LE strength and stability along with neutral spine core and hip strengthening in order to decrease pain. HEP updated for continued progression of strength, especially in quad and proximal hip. Need to add plank goal.    EVAL: Patient is a 23 y.o. female who was seen today for physical therapy evaluation and treatment for knee and hip pain due to likely Hypermobility Syndrome. See above for criteria.  She was referred to Dr. Joane to provide further guidance related to this condition.   OBJECTIVE IMPAIRMENTS: Abnormal gait, decreased activity tolerance, difficulty walking, decreased strength, increased fascial restrictions, increased muscle spasms, impaired flexibility, postural dysfunction, and pain.   ACTIVITY LIMITATIONS: carrying, lifting, bending, sitting, standing, squatting, sleeping, stairs, transfers, bed mobility, dressing, and locomotion level  PARTICIPATION LIMITATIONS: cleaning, laundry, interpersonal relationship, shopping, community activity, and occupation  PERSONAL FACTORS: Past/current experiences, Social background, and 3+ comorbidities: chronic pain, migraines, hypermobility, syrinx are also affecting patient's functional outcome.   REHAB POTENTIAL: Good  CLINICAL DECISION MAKING: Evolving/moderate complexity  EVALUATION COMPLEXITY:  Moderate   GOALS: Goals reviewed with patient? Yes  SHORT TERM GOALS: Target date: 01/20/2024  Patient will be able to show independence for initial HEP to include posture, core and hip strength and stability.   Baseline:unknown  Goal status: INITIAL  2.  Patient will be able to complete functional testing and set goal (2 min walk, plank) Baseline: NT  01/18/24: 450 feet  Goal status: ONGOING  3.  Pt will understand posture and joint protection as it pertains to home tasks, ADLs Baseline: needs reinforcement  Goal status: INITIAL  LONG TERM GOALS: Target date: 02/17/2024    Patient will be independent with final HEP upon discharge from PT and report consistent benefit following exercise completion.    Baseline: unknown  Goal status: INITIAL  2.  Patient will be able to improve 2 min walk test distance by 50 feet or more with LRAD and no increase in pain.   Baseline: TBA 01/18/24: 450 feet  Goal status: ONGOING  3.  Pt will be able to navigate her stairs forward facing and 1 rail with min increase in knee pain 75% po the time.  Baseline: sidestepping  most of the time  Goal status: INITIAL  4.  Pt will note improved ability to stand for baking, cooking for 20 min with no more than 1 rest break Baseline: 5 min frequent rest breaks  Goal status: INITIAL  5.  Pt will be able to perform home tasks (folding laundry) in standing for up to 10 min with min fatigue Baseline: mod fatigue  Goal status: INITIAL  PLAN:  PT FREQUENCY: 1-2x/week  PT DURATION: 8 weeks  PLANNED INTERVENTIONS: 97164- PT Re-evaluation, 97750- Physical Performance Testing, 97110-Therapeutic exercises, 97530- Therapeutic activity, [id number]- Neuromuscular re-education, 97535- Self Care, [id number]- Manual therapy, [phone]- Gait training, Patient/Family education, Balance training, Stair training, Taping, Cryotherapy, and Moist heat.  PLAN FOR NEXT SESSION: check HEP, try nustep. how was tape? Add in hip strength as  tol. Try Bike , add plank goal    Harlene Persons, PTA 01/18/24 10:55 AM Phone: [phone] Fax: [phone]

## 2024-01-20 ENCOUNTER — Ambulatory Visit: Admitting: Psychology

## 2024-01-20 ENCOUNTER — Encounter: Payer: Self-pay | Admitting: Psychology

## 2024-01-20 DIAGNOSIS — F331 Major depressive disorder, recurrent, moderate: Secondary | ICD-10-CM | POA: Diagnosis not present

## 2024-01-20 DIAGNOSIS — F411 Generalized anxiety disorder: Secondary | ICD-10-CM | POA: Diagnosis not present

## 2024-01-20 NOTE — Progress Notes (Signed)
   Bryson Dames, PhD
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
## 2024-01-26 ENCOUNTER — Ambulatory Visit: Admitting: Physical Therapy

## 2024-02-01 ENCOUNTER — Other Ambulatory Visit: Admitting: Psychology

## 2024-02-01 ENCOUNTER — Ambulatory Visit: Attending: Family Medicine

## 2024-02-01 DIAGNOSIS — M6281 Muscle weakness (generalized): Secondary | ICD-10-CM | POA: Diagnosis present

## 2024-02-01 DIAGNOSIS — R2689 Other abnormalities of gait and mobility: Secondary | ICD-10-CM | POA: Diagnosis present

## 2024-02-01 DIAGNOSIS — R2681 Unsteadiness on feet: Secondary | ICD-10-CM | POA: Diagnosis present

## 2024-02-01 DIAGNOSIS — M2550 Pain in unspecified joint: Secondary | ICD-10-CM | POA: Insufficient documentation

## 2024-02-01 DIAGNOSIS — M357 Hypermobility syndrome: Secondary | ICD-10-CM | POA: Diagnosis present

## 2024-02-01 NOTE — Therapy (Signed)
 OUTPATIENT PHYSICAL THERAPY TREATMENT  Patient Name: Lori Wolf MRN: [id number] DOB:04-05-2001, 23 y.o., female Today's Date: 02/01/2024  END OF SESSION:  PT End of Session - 02/01/24 0948     Visit Number 4    Number of Visits 16    Date for Recertification  02/17/24    Authorization Type [facility]    Authorization Time Period Approved 10 visits 12/23/23-02/21/24    Authorization - Visit Number 3    Authorization - Number of Visits 10    PT Start Time 0945   Late due to issue with her ride   PT Stop Time 1015    PT Time Calculation (min) 30 min    Activity Tolerance Patient tolerated treatment well    Behavior During Therapy WFL for tasks assessed/performed            Past Medical History:  Diagnosis Date   Migraine with aura    Migraines    History reviewed. No pertinent surgical history. 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) 06/19/2019   Numbness and tingling of right arm 06/19/2019    PCP: Roanna Piedmont MD   REFERRING PROVIDER: Cecilia Credit NP (same practice)   REFERRING DIAG: [phone] (ICD-10-CM) - Pain in right lower leg M79.662 (ICD-10-CM) - Pain in left lower leg  Rationale for Evaluation and Treatment: Rehabilitation  THERAPY DIAG:  Polyarthralgia  Hypermobility syndrome  Unsteadiness on feet  Muscle weakness (generalized)  Other abnormalities of gait and mobility  ONSET DATE: chronic   SUBJECTIVE:                                                                                                                                                                                           SUBJECTIVE STATEMENT: Pt reports starting prednisone which caused fatigue, limiting her ability to complete her HEP.  EVAL: Pt presents with cluster of symptoms  of  multiple joint pain, swelling, tightness in muscles and joint instability.  She was told she was hypermobile, trying to get to Rheum for RA workup as well.  Her days are variable, often very limited by lower body joint pain.  She reports chief joints affected Rt > L knee and hips. She has had knee injections, imaging. Patient reports clicking, catching with walking upstairs, has to side step at times.   When it is colder, she has shuffling, stiff legged walk, uses cane often and can't walk long periods. She has fatigue, standing  5-10 min only It is hard to be still.  Times when she has to crouch and shift weight continuously  Has had PT over the years for various injuries.    Has the patient been formally diagnosed with EDS or HSD? NO If yes indicate the type If no ,does the patient feel they may have EDS or HSD? YES   Patient reports symptoms and/or instability in the following areas:  - Cervical Spine: [] Pain [] Instability [] Limited ROM [] Paresthesia  - Thoracic Spine: [x] Pain [] Instability  - Lumbar Spine: [x] Pain [] Instability [] Frequent locking/giving out  - Shoulder: [] L [] R  [] Subluxation [] Dislocations [] Pain [] Fatigue  - Elbow: [] L [] R  [] Instability [] Hyperextension [x] Pain  - Wrist/Hand: [] L [] R  [] Instability [] Fatigue with use [x] Pain  - Hip: [x] L [x] R  [] Instability [x] Pain [x] Clicking [] Gait deviations  - Knee: [x] L [x] R  [x] Instability [] Hyperextension [x] Pain  - Ankle/Foot: [] L [] R  [] Instability [] Frequent sprains Pain   Does the patient have a diagnosis of any of the following: Fatigue[x]  Brain fog[x]  Lightheadedness[] ? Allergies[]  GI[x]  Slow mobility  Neurodiverse[x]   Additional Notes:  ______________________________________   Patient-reported Beighton Score (if known): _____?__/9  Clinician-assessed Beighton Score (today): _____8__/9     PERTINENT HISTORY:  EDS, scoliosis, murmur see below  Migraine  Unknown family history- grandmom has RA, mom  passed when she was 13 from [place]    Syringomyelia  Relevant historical information:  (From neuro PT) Pt presents to PT eval without AD. 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 Pt often uses a cane due to leg pain (R>L) that runs up her thigh to her hip and low back and makes ADLs very difficult. 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 Recently sprained her R ankle due to running to an emergency at camp in the rain while wearing Chacos. States her knees often swell up, even after swimming. Swimming is very painful. Denies lightheadedness or dizziness but does report history of tachycardia. Has a hard time going up and down stairs, has to navigate sideways due to knee pain. In the winter, has to walk with a shuffled gait pattern as her knees lock up on me.      PAIN:  Are you having pain?  Yes: NPRS scale: pain diffuse knee hips, moderate , baseline 5/10 Pain location: hips, knees   Pain description: stiffness, Aggravating factors: can be random, begin on cycle , standing, walking, swimming increases swelling Relieving factors: nothing really , has a knee brace min relief, walks with cane   PRECAUTIONS: Other: monitor lower body joints   RED FLAGS: None   WEIGHT BEARING RESTRICTIONS: No  FALLS:  Has patient fallen in last 6 months? 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  She still does weekends throughout the year.   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   OBJECTIVE:  Note: Objective measures were completed at Evaluation unless otherwise noted.  DIAGNOSTIC FINDINGS:  Cervical and Thoracic MRI  IMPRESSION: 1. Small syrinx spanning the C6-T1 levels, unchanged from the prior examination of 12/18/2020. 2. No significant disc degeneration, disc herniation, spinal canal stenosis or neural foraminal narrowing within the cervical spine. 3. At T7-T8, there is mild disc degeneration. A small right center disc protrusion (at site of posterior annular fissure) focally effaces the ventral thecal sac, and slightly flattens the right ventrolateral aspect of the spinal cord. However, the dorsal CSF space is maintained within the spinal canal. These findings are unchanged.  CARDIO/ORTHOSTATICS: Baseline RHR 65 prone,   97 sitting  ,  standing 104  Did not check BP as well aymptomatic   PATIENT SURVEYS:  PSFS: THE PATIENT SPECIFIC FUNCTIONAL SCALE  Place score of 0-10 (0 = unable to perform activity and 10 = able to perform activity at the same level as before injury or problem)  Activity Date: 12/23/23    Stand to cook a meal  4    2.Daily chores 4    3.Walk without breaks  2    4.      Total Score 10      Total Score = Sum of activity scores/number of activities  Minimally Detectable Change: 3 points (for single activity); 2 points (for average score)  [facility] (nd). The Patient Specific Functional Scale . Retrieved from [email or web address]   COGNITION: Overall cognitive status: Within functional limits for tasks assessed     SENSATION: WFL sometimes L digits 4-5    POSTURE: rounded shoulders, forward head, and moves during session, shifts weight to Rt UE   PALPATION: Patellar pain with gliding proximally and med/laterally Rt knee > L.    LUMBAR ROM:   AROM eval  Flexion WNL distal shin- limited by hamstrings   Extension WNL   Right lateral flexion WNL  Left lateral flexion WNL  Right rotation WNL  Left rotation WNL    (Blank rows = not tested)  LOWER EXTREMITY ROM:   WFL   Active  Right eval Left eval  Hip flexion Not hyper Not hyper  Hip extension    Hip abduction    Hip adduction    Hip internal rotation    Hip external rotation    Knee flexion    Knee extension +10 +10  Ankle dorsiflexion    Ankle plantarflexion    Ankle inversion    Ankle eversion     (Blank rows = not tested)  LOWER EXTREMITY MMT:    MMT Right eval Left eval  Hip flexion    Hip extension    Hip abduction    Hip adduction    Hip internal rotation    Hip external rotation    Knee flexion 5 5  Knee extension 5 5  Ankle dorsiflexion    Ankle plantarflexion    Ankle inversion    Ankle eversion     (Blank rows = not tested)  LUMBAR SPECIAL TESTS:  None   FUNCTIONAL TESTS:  SLS normal each LE  01/18/24: : 450 feet  01/18/24: forearm Plank 27 sec     Beighton Scale Lumbar (_0/1) Knees (_2/2) Elbows (2_/2)  5th digit (2_2) Thumb (_2/2) 8/9 Comment on hips, shoulders     TREATMENT DATE:  [facility] Adult PT Treatment:  DATE: 02/01/24 Therapeutic Exercise: Nustep L2 UE/LE x 5 minutes  QS x 10 each  SLR x 10 c AB engage c pilates ring AB bracing x5 10 90d heel taps for Abs 2x10 Hip add set c ball x10 H/L hip clams GTB x10 Bridging c add ball squeeze x10 Bridging c iso clams BluTB x10 Quad AP rocks Quad arm lifts x10 Quad leg  x5 each  [facility] Adult PT Treatment:                                                DATE: 01/18/24 Therapeutic Exercise: Nustep L2 UE/LE x 5 minutes  QS x 10 each  SLR 2 x 10  Forearm plank on toes 27 sec 1 Rep Max  Green banded Bridge 10 x 1 HL clam GTB with Ab brace  PPT with ball squeee x 10   Therapeutic Activity: 2 MWT -450  feet, increases right knee pain.  Nustep L2 x 5 minutes for activity tolerance- mild increase in right hip and knee pain                                                                              PATIENT EDUCATION:  Education details: see above  Person educated: Patient Education method: Explanation, Demonstration, and Handouts Education comprehension: verbalized understanding and emailed needs reinforcement   Major Criteria Beighton Score >4 ?  __8___ 4 or more joints consistent arthralgia > 3 mos ? ____yes__   Minor Criteria Beighton Score 1-3?  ______  Arthralgia in 1-3 joints consistently for 1-3 mos OR back pain > 3 mos?   OR spondylolisthesis/spondylosis?  _______  Subluxation or dislocation in > 1 joint or a single joint 2 or more times? _____n__  3 ore more of the following: epicondylitis, tenosynovitis or bursitis? __interspinal bursitis____  Habitus with arm span  height> 1.03 OR uper limb segment >lower limb segment ratio < 0.89 OR arachnodactyly? ________  Skin stretches > 4 cm without resistance, striae or abnormal scarring? ____YES____  Dysoopomhic eyelids, myopia, antimongoloid slant? ______  Varicose veins, hernia or prolapse or uterus or rectum? ______  MItral valve prolapse? __murmur_____  Diagnosis requires one or more of the following:  ___x__2 major  _____One major and 2 minor  _____ 4 minor  _____2 minor and a first degree relative who is unequivocally affected       HOME EXERCISE PROGRAM: Access Code: [email or web address] URL: [email or web address] Date: 01/13/2024 Prepared by: Alm Kingdom  Exercises - Supine Quad Set  - 1 x daily - 7 x weekly - 2 sets - 10 reps - 5 hold - Active Straight Leg Raise with Quad Set  - 1 x daily - 7 x weekly - 2 sets - 10 reps - 5 hold - Long Sitting Hamstring Set  - 1 x daily - 7 x weekly - 2 sets - 10 reps - 5 hold - Single Leg Stance with Support  - 1 x daily - 7 x weekly - 1 sets - 5 reps - 30 hold -  Supine Bridge with Resistance Band  -  1 x daily - 7 x weekly - 3 sets - 8 reps - red band hold - Hooklying Clamshell with Resistance  - 1 x daily - 7 x weekly - 3 sets - 15 reps - green band hold Added 01/18/24 - Standard Plank  - 1 x daily - 7 x weekly - 1-3 reps - 10-30 hold  ASSESSMENT:  CLINICAL IMPRESSION: Time in PT was limited due to pt arriving late due to ride issues. PT was completed for pelvic, hip, LE strengthening. In Maynard position, pt demonstrated increased lordosis secondary to weak abominals. Pt tolerated prescribed exs in PT today without adverse effects. Pt will continue to benefit from skilled PT to address impairments for improved function.    EVAL: Patient is a 23 y.o. female who was seen today for physical therapy evaluation and treatment for knee and hip pain due to likely Hypermobility Syndrome. See above for criteria.  She was referred to Dr. Joane to provide further guidance related to this condition.   OBJECTIVE IMPAIRMENTS: Abnormal gait, decreased activity tolerance, difficulty walking, decreased strength, increased fascial restrictions, increased muscle spasms, impaired flexibility, postural dysfunction, and pain.   ACTIVITY LIMITATIONS: carrying, lifting, bending, sitting, standing, squatting, sleeping, stairs, transfers, bed mobility, dressing, and locomotion level  PARTICIPATION LIMITATIONS: cleaning, laundry, interpersonal relationship, shopping, community activity, and occupation  PERSONAL FACTORS: Past/current experiences, Social background, and 3+ comorbidities: chronic pain, migraines, hypermobility, syrinx are also affecting patient's functional outcome.   REHAB POTENTIAL: Good  CLINICAL DECISION MAKING: Evolving/moderate complexity  EVALUATION COMPLEXITY: Moderate   GOALS: Goals reviewed with patient? Yes  SHORT TERM GOALS: Target date: 01/20/2024  Patient will be able to show independence for initial HEP to include posture, core and hip strength  and stability.   Baseline:unknown  Goal status: INITIAL  2.  Patient will be able to complete functional testing and set goal (2 min walk, plank) Baseline: NT  01/18/24: 450 feet  Goal status: ONGOING  3.  Pt will understand posture and joint protection as it pertains to home tasks, ADLs Baseline: needs reinforcement  Goal status: INITIAL  LONG TERM GOALS: Target date: 02/17/2024    Patient will be independent with final HEP upon discharge from PT and report consistent benefit following exercise completion.    Baseline: unknown  Goal status: INITIAL  2.  Patient will be able to improve 2 min walk test distance by 50 feet or more with LRAD and no increase in pain.   Baseline: TBA 01/18/24: 450 feet  Goal status: ONGOING  3.  Pt will be able to navigate her stairs forward facing and 1 rail with min increase in knee pain 75% po the time.  Baseline: sidestepping most of the time  Goal status: INITIAL  4.  Pt will note improved ability to stand for baking, cooking for 20 min with no more than 1 rest break Baseline: 5 min frequent rest breaks  Goal status: INITIAL  5.  Pt will be able to perform home tasks (folding laundry) in standing for up to 10 min with min fatigue Baseline: mod fatigue  Goal status: INITIAL  PLAN:  PT FREQUENCY: 1-2x/week  PT DURATION: 8 weeks  PLANNED INTERVENTIONS: [id number]- PT Re-evaluation, 97750- Physical Performance Testing, 97110-Therapeutic exercises, 97530- Therapeutic activity, [id number]- Neuromuscular re-education, 97535- Self Care, [id number]- Manual therapy, [phone]- Gait training, Patient/Family education, Balance training, Stair training, Taping, Cryotherapy, and Moist heat.  PLAN FOR NEXT SESSION: check HEP, try nustep. how was tape? Add in hip strength as tol.  Try Bike , add plank goal   [employer] MS, PT 02/01/24 1:12 PM

## 2024-02-06 ENCOUNTER — Other Ambulatory Visit: Admitting: Psychology

## 2024-02-08 ENCOUNTER — Ambulatory Visit

## 2024-02-08 DIAGNOSIS — M6281 Muscle weakness (generalized): Secondary | ICD-10-CM

## 2024-02-08 DIAGNOSIS — R2681 Unsteadiness on feet: Secondary | ICD-10-CM

## 2024-02-08 DIAGNOSIS — M357 Hypermobility syndrome: Secondary | ICD-10-CM

## 2024-02-08 DIAGNOSIS — M2550 Pain in unspecified joint: Secondary | ICD-10-CM

## 2024-02-08 NOTE — Therapy (Signed)
 OUTPATIENT PHYSICAL THERAPY TREATMENT  Patient Name: Lori Wolf MRN: [id number] DOB:03-Sep-2000, 23 y.o., female Today's Date: 02/08/2024  END OF SESSION:  PT End of Session - 02/08/24 0914     Visit Number 5    Number of Visits 16    Date for Recertification  02/17/24    Authorization Type [facility]    Authorization Time Period Approved 10 visits 12/23/23-02/21/24    Authorization - Visit Number 4    Authorization - Number of Visits 10    PT Start Time 0930    PT Stop Time 1010    PT Time Calculation (min) 40 min    Activity Tolerance Patient tolerated treatment well    Behavior During Therapy WFL for tasks assessed/performed             Past Medical History:  Diagnosis Date   Migraine with aura    Migraines    History reviewed. No pertinent surgical history. 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) 06/19/2019   Numbness and tingling of right arm 06/19/2019    PCP: Roanna Piedmont MD   REFERRING PROVIDER: Cecilia Credit NP (same practice)   REFERRING DIAG: [phone] (ICD-10-CM) - Pain in right lower leg M79.662 (ICD-10-CM) - Pain in left lower leg  Rationale for Evaluation and Treatment: Rehabilitation  THERAPY DIAG:  Polyarthralgia  Hypermobility syndrome  Unsteadiness on feet  Muscle weakness (generalized)  ONSET DATE: chronic   SUBJECTIVE:                                                                                                                                                                                           SUBJECTIVE STATEMENT: Pt presents to PT with reports of fatigue but no current pain. Did wake up the other day with her R patella out of place.   EVAL: Pt presents with cluster of symptoms  of multiple joint pain, swelling, tightness in  muscles and joint instability.  She was told she was hypermobile, trying to get to Rheum for RA workup as well.  Her days are variable, often very limited by lower body joint pain.  She reports chief joints affected Rt > L knee and hips. She has had knee injections, imaging. Patient reports clicking, catching with walking upstairs, has to side step at times.   When it is colder, she has shuffling, stiff legged walk, uses cane often and can't walk long periods. She has fatigue, standing 5-10 min  only It is hard to be still.  Times when she has to crouch and shift weight continuously  Has had PT over the years for various injuries.    Has the patient been formally diagnosed with EDS or HSD? NO If yes indicate the type If no ,does the patient feel they may have EDS or HSD? YES   Patient reports symptoms and/or instability in the following areas:  - Cervical Spine: [] Pain [] Instability [] Limited ROM [] Paresthesia  - Thoracic Spine: [x] Pain [] Instability  - Lumbar Spine: [x] Pain [] Instability [] Frequent locking/giving out  - Shoulder: [] L [] R  [] Subluxation [] Dislocations [] Pain [] Fatigue  - Elbow: [] L [] R  [] Instability [] Hyperextension [x] Pain  - Wrist/Hand: [] L [] R  [] Instability [] Fatigue with use [x] Pain  - Hip: [x] L [x] R  [] Instability [x] Pain [x] Clicking [] Gait deviations  - Knee: [x] L [x] R  [x] Instability [] Hyperextension [x] Pain  - Ankle/Foot: [] L [] R  [] Instability [] Frequent sprains Pain   Does the patient have a diagnosis of any of the following: Fatigue[x]  Brain fog[x]  Lightheadedness[] ? Allergies[]  GI[x]  Slow mobility  Neurodiverse[x]   Additional Notes:  ______________________________________   Patient-reported Beighton Score (if known): _____?__/9  Clinician-assessed Beighton Score (today): _____8__/9     PERTINENT HISTORY:  EDS, scoliosis, murmur see below  Migraine  Unknown family history- grandmom has RA, mom passed when she was 13 from [place]     Syringomyelia  Relevant historical information:  (From neuro PT) Pt presents to PT eval without AD. 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 Pt often uses a cane due to leg pain (R>L) that runs up her thigh to her hip and low back and makes ADLs very difficult. 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 Recently sprained her R ankle due to running to an emergency at camp in the rain while wearing Chacos. States her knees often swell up, even after swimming. Swimming is very painful. Denies lightheadedness or dizziness but does report history of tachycardia. Has a hard time going up and down stairs, has to navigate sideways due to knee pain. In the winter, has to walk with a shuffled gait pattern as her knees lock up on me.      PAIN:  Are you having pain?  Yes: NPRS scale: pain diffuse knee hips, moderate , baseline 5/10 Pain location: hips, knees   Pain description: stiffness, Aggravating factors: can be random, begin on cycle , standing, walking, swimming increases swelling Relieving factors: nothing really , has a knee brace min relief, walks with cane   PRECAUTIONS: Other: monitor lower body joints   RED FLAGS: None   WEIGHT BEARING RESTRICTIONS: No  FALLS:  Has patient fallen in last 6 months? 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  She still does weekends throughout the year.   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   OBJECTIVE:  Note: Objective  measures were completed at Evaluation unless otherwise noted.  DIAGNOSTIC FINDINGS:  Cervical and Thoracic MRI  IMPRESSION: 1. Small syrinx spanning the C6-T1 levels, unchanged from the prior examination of 12/18/2020. 2. No significant disc degeneration, disc herniation, spinal canal stenosis or neural foraminal narrowing within the cervical spine. 3. At T7-T8, there is mild disc degeneration. A small right center disc protrusion (at site of posterior annular fissure) focally effaces the ventral thecal sac, and slightly flattens the right ventrolateral aspect of the spinal cord. However, the dorsal CSF space is maintained within the spinal canal. These findings are unchanged.  CARDIO/ORTHOSTATICS: Baseline RHR 65 prone,   97 sitting  ,  standing 104  Did not check BP as well aymptomatic   PATIENT SURVEYS:  PSFS: THE PATIENT SPECIFIC FUNCTIONAL SCALE  Place score of 0-10 (0 = unable to perform activity and 10 = able to perform activity at the same level as before injury or problem)  Activity Date: 12/23/23    Stand to cook a meal  4    2.Daily chores 4    3.Walk without breaks  2    4.      Total Score 10      Total Score = Sum of activity scores/number of activities  Minimally Detectable Change: 3 points (for single activity); 2 points (for average score)  [facility] (nd). The Patient Specific Functional Scale . Retrieved from [email or web address]   COGNITION: Overall cognitive status: Within functional limits for tasks assessed     SENSATION: WFL sometimes L digits 4-5    POSTURE: rounded shoulders, forward head, and moves during session, shifts weight to Rt UE   PALPATION: Patellar pain with gliding proximally and med/laterally Rt knee > L.   LUMBAR ROM:   AROM eval   Flexion WNL distal shin- limited by hamstrings   Extension WNL   Right lateral flexion WNL  Left lateral flexion WNL  Right rotation WNL  Left rotation WNL    (Blank rows = not tested)  LOWER EXTREMITY ROM:   WFL   Active  Right eval Left eval  Hip flexion Not hyper Not hyper  Hip extension    Hip abduction    Hip adduction    Hip internal rotation    Hip external rotation    Knee flexion    Knee extension +10 +10  Ankle dorsiflexion    Ankle plantarflexion    Ankle inversion    Ankle eversion     (Blank rows = not tested)  LOWER EXTREMITY MMT:    MMT Right eval Left eval  Hip flexion    Hip extension    Hip abduction    Hip adduction    Hip internal rotation    Hip external rotation    Knee flexion 5 5  Knee extension 5 5  Ankle dorsiflexion    Ankle plantarflexion    Ankle inversion    Ankle eversion     (Blank rows = not tested)  LUMBAR SPECIAL TESTS:  None   FUNCTIONAL TESTS:  SLS normal each LE  01/18/24: : 450 feet  01/18/24: forearm Plank 27 sec     Beighton Scale Lumbar (_0/1) Knees (_2/2) Elbows (2_/2)  5th digit (2_2) Thumb (_2/2) 8/9 Comment on hips, shoulders     TREATMENT DATE:  [facility] Adult PT Treatment:  DATE: 02/08/24 Supine QS x 10 - 5 hold  Pilates SLR 2x10 each Pilates bridge 3x10 Hooklying clamshell 2x15 blue band S/L hip abd 2x10 2# Hooklying PPT x 10 - 5 hold Hooklyling PPT with ball 2x10 - 5 hold 90/90 hold 2x20 90/90 alt taps 2x10 Bird dog 2x10 Childs pose 30                                                                              PATIENT EDUCATION:  Education details: see above  Person educated: Patient Education method: Programmer, multimedia, Facilities manager, and Handouts Education comprehension: verbalized understanding and emailed needs reinforcement   Major Criteria Beighton Score >4 ?  __8___ 4 or more joints consistent arthralgia > 3 mos ?  ____yes__   Minor Criteria Beighton Score 1-3?  ______  Arthralgia in 1-3 joints consistently for 1-3 mos OR back pain > 3 mos?   OR spondylolisthesis/spondylosis?  _______  Subluxation or dislocation in > 1 joint or a single joint 2 or more times? _____n__  3 ore more of the following: epicondylitis, tenosynovitis or bursitis? __interspinal bursitis____  Habitus with arm span  height> 1.03 OR uper limb segment >lower limb segment ratio < 0.89 OR arachnodactyly? ________  Skin stretches > 4 cm without resistance, striae or abnormal scarring? ____YES____  Dysoopomhic eyelids, myopia, antimongoloid slant? ______  Varicose veins, hernia or prolapse or uterus or rectum? ______  MItral valve prolapse? __murmur_____  Diagnosis requires one or more of the following:  ___x__2 major  _____One major and 2 minor  _____ 4 minor  _____2 minor and a first degree relative who is unequivocally affected       HOME EXERCISE PROGRAM: Access Code: [email or web address] URL: [email or web address] Date: 01/13/2024 Prepared by: Alm Kingdom  Exercises - Supine Quad Set  - 1 x daily - 7 x weekly - 2 sets - 10 reps - 5 hold - Active Straight Leg Raise with Quad Set  - 1 x daily - 7 x weekly - 2 sets - 10 reps - 5 hold - Long Sitting Hamstring Set  - 1 x daily - 7 x weekly - 2 sets - 10 reps - 5 hold - Single Leg Stance with Support  - 1 x daily - 7 x weekly - 1 sets - 5 reps - 30 hold - Supine Bridge with Resistance Band  - 1 x daily - 7 x weekly - 3 sets - 8 reps - red band hold - Hooklying Clamshell with Resistance  - 1 x daily - 7 x weekly - 3 sets - 15 reps - green band hold Added 01/18/24 - Standard Plank  - 1 x daily - 7 x weekly - 1-3 reps - 10-30 hold  ASSESSMENT:  CLINICAL IMPRESSION: Pt was able to complete all prescribed exercises with no adverse effect. Exercises today continued to focus on improving core motor control and hip strength in neutral spine. Did have some pain with  bird dog in R hip, overall though is doing well. Pt is progressing with therapy, will continue per POC.   EVAL: Patient is a 23 y.o. female who was seen today for physical therapy evaluation and treatment for knee and hip pain due to  likely Hypermobility Syndrome. See above for criteria.  She was referred to Dr. Joane to provide further guidance related to this condition.   OBJECTIVE IMPAIRMENTS: Abnormal gait, decreased activity tolerance, difficulty walking, decreased strength, increased fascial restrictions, increased muscle spasms, impaired flexibility, postural dysfunction, and pain.   ACTIVITY LIMITATIONS: carrying, lifting, bending, sitting, standing, squatting, sleeping, stairs, transfers, bed mobility, dressing, and locomotion level  PARTICIPATION LIMITATIONS: cleaning, laundry, interpersonal relationship, shopping, community activity, and occupation  PERSONAL FACTORS: Past/current experiences, Social background, and 3+ comorbidities: chronic pain, migraines, hypermobility, syrinx are also affecting patient's functional outcome.   REHAB POTENTIAL: Good  CLINICAL DECISION MAKING: Evolving/moderate complexity  EVALUATION COMPLEXITY: Moderate   GOALS: Goals reviewed with patient? Yes  SHORT TERM GOALS: Target date: 01/20/2024  Patient will be able to show independence for initial HEP to include posture, core and hip strength and stability.   Baseline: unknown  Goal status: MET  2.  Patient will be able to complete functional testing and set goal (2 min walk, plank) Baseline: NT  01/18/24: 450 feet  Goal status: ONGOING  3.  Pt will understand posture and joint protection as it pertains to home tasks, ADLs Baseline: needs reinforcement  Goal status: MET  LONG TERM GOALS: Target date: 02/17/2024    Patient will be independent with final HEP upon discharge from PT and report consistent benefit following exercise completion.    Baseline: unknown  Goal status: INITIAL  2.   Patient will be able to improve 2 min walk test distance by 50 feet or more with LRAD and no increase in pain.   Baseline: TBA 01/18/24: 450 feet  Goal status: ONGOING  3.  Pt will be able to navigate her stairs forward facing and 1 rail with min increase in knee pain 75% po the time.  Baseline: sidestepping most of the time  Goal status: INITIAL  4.  Pt will note improved ability to stand for baking, cooking for 20 min with no more than 1 rest break Baseline: 5 min frequent rest breaks  Goal status: INITIAL  5.  Pt will be able to perform home tasks (folding laundry) in standing for up to 10 min with min fatigue Baseline: mod fatigue  Goal status: INITIAL  PLAN:  PT FREQUENCY: 1-2x/week  PT DURATION: 8 weeks  PLANNED INTERVENTIONS: 97164- PT Re-evaluation, 97750- Physical Performance Testing, 97110-Therapeutic exercises, 97530- Therapeutic activity, [id number]- Neuromuscular re-education, 97535- Self Care, 02859- Manual therapy, [phone]- Gait training, Patient/Family education, Balance training, Stair training, Taping, Cryotherapy, and Moist heat.  PLAN FOR NEXT SESSION: check HEP, try nustep. how was tape? Add in hip strength as tol. Try Bike , add plank goal   Alm JAYSON Kingdom PT  02/08/24 10:27 AM

## 2024-02-11 NOTE — ED Provider Notes (Signed)
 ------------------------------------------------------------------------------- Attestation signed by Carmelita Keller Mania, MD at 02/13/2024  5:49 PM ED Supervisory Statement:  I have evaluated the patient concurrently with the resident. I agree with the resident's history, physical, medical decision making, and plan as documented.    Plan is discharge with return precautions and recommendations for outpatient follow-up.  -------------------------------------------------------------------------------  [facility] Emergency Department Provider Note   Chief Complaint: Headache    History of Present Illness  Lori Wolf is a 23 y.o. female with a PMHx of syrinx C6-T1, scoliosis, migraine headache, anxiety, depression, ADHD who presents to the emergency department for evaluation of migraine.    Patient reports a history of 3 days of migraine with sensitivity to light, sound, smell. Has tried hydroxyzine, OTC tylenol and sleeping but nothing has helped with pain. Reports she has been experiencing nausea, loss of appetite, neck and jaw pain which is typical for her with migraines. Patient states she typically gets migraine everyday and is following closely with neurology, has tried 2 courses of Botox which has helped in the past.     Arrived in ED by: self History obtained from: Patient No translator used   ED Course & Medical Decision Making  Lori Wolf is a 23 y.o. female who presented to the ED today with migraine as detailed above.  On arrival, patient was hemodynamically stable. On assessment, patient is pleasant and able to participate in evaluation. No red flag symptoms at this point, therefore will defer on imaging. Patient states she does not want any sedating medication, given decadron, Toradol, Zofran.    Differential Diagnoses include (but are not limited to): Migraine, headache   Plan: Will obtain no labs at this time      Patient re-evaluated patient reports she is feeling much better than before and would like to be discharged. Encouraged patient to continue neurology appointments, and follow up with PCP. Patient told to present to the ED if symptoms persist or worsens. Vital signs are BP 119/79, temp 98.1, HR 85, Resp 18, SpO2. Patient is hemodynamically stable.   Disposition: Due to the patient's current presenting symptoms, physical exam findings, and the workup stated above, the most likely diagnosis is migraine headache.  Discharge: Patient is felt to be medically appropriate for discharge at this time. Patient was informed of all pertinent findings of the physical examination and workup above. Patient's suspected etiology of their symptom presentation was discussed and all questions were answered. Specific strict return precautions were provided to the patient. I encouraged them to follow up with their PCP on an outpatient basis. Patient discharged in stable condition.    Physical Exam  Initial Vital Signs for the past 24 hrs (Last 1 readings):  BP Temp Temp src Pulse Resp SpO2  02/11/24 1103 133/86 98.7 F (37.1 C) Oral 90 18 98 %   Physical Exam Constitutional:      Appearance: She is well-developed.  Eyes:     General: No visual field deficit. Cardiovascular:     Rate and Rhythm: Normal rate and regular rhythm.     Heart sounds: Normal heart sounds.  Pulmonary:     Effort: Pulmonary effort is normal.     Breath sounds: Normal breath sounds.  Abdominal:     General: Bowel sounds are normal. There is no distension.     Palpations: Abdomen is soft. There is no mass.     Tenderness: There is no guarding.  Neurological:     Mental Status: She  is alert and oriented to person, place, and time.     Cranial Nerves: No cranial nerve deficit, dysarthria or facial asymmetry.     Sensory: No sensory deficit.    Procedures  Procedures  Dispo Summary  The patient was seen, evaluated, and treated  in conjunction with the attending physician Dr. Perley who voiced agreement in the care provided.  Diagnosis: No diagnosis found. Follow up: With PCP Disposition: Data Unavailable   Past Contributory History  Allergies: Peanut and Peanut allergen powder-dnfp   Personal History: Medical History[1]  Surgical History[2]   Results  Labs: Abnormal Labs Reviewed - No data to display   Imaging: No orders to display                 [1] Past Medical History: Diagnosis Date  . ADHD (attention deficit hyperactivity disorder)   . Anxiety   . Dysphoric mood   . Headache   . Heart murmur   . Hyperactive   . Hypermobile joints   . Numbness   . Right arm weakness   . Scoliosis   . Seasonal allergies   . Seizures    (CMD) 2005   febrile seizure (105')  x 1 - 3 y/o  . Syrinx of spinal cord    (CMD)    C6-T1  . Tremor   [2] Past Surgical History: Procedure Laterality Date  . NO PAST SURGERIES     Procedure: NO PAST SURGERIES

## 2024-02-13 ENCOUNTER — Ambulatory Visit: Admitting: Psychology

## 2024-02-13 NOTE — Telephone Encounter (Signed)
 [facility]ication [facility] Summary  PA Status Approved  Medication Mickel 30mg   PA Approval Dates   PA Number 02/13/2024-05/13/2024  [id number]  Provider Corean Netter  Insurance Company [facility] Medicaid   Approval letter saved to Media tab

## 2024-02-17 ENCOUNTER — Ambulatory Visit

## 2024-02-22 NOTE — Telephone Encounter (Signed)
 BOTOX - NO PA REQUIRED [facility]ication [facility] received a Prior Authorization request for BOTOX from LAURA GRANETZKE. [employer] Managed Medicaid No PA required Botox [id number], 64615 for ICD-10 G43.719,G43.119, G43.E19 per [facility] code lookup tool and [facility] Prior Authorization is not required for Traditional, Managed or Tailored Medicaid. Medicaid active - verified 02/22/2024. Medicaid coverage must be verified after the first business day of the appointment month. Patient can be scheduled every 90 days. Reference #: [id number]  OK to Buy & Bill through medical benefit  [facility] Outpatient setting DOS 03/09/2024; VISIT 1 OF 2 (200 U)  Marca Burnet, CPhT, [facility]ication [facility]

## 2024-03-06 ENCOUNTER — Ambulatory Visit

## 2024-03-06 VITALS — BP 106/72 | HR 87 | Temp 98.1°F | Resp 16 | Ht 64.0 in | Wt 162.0 lb

## 2024-03-06 DIAGNOSIS — M2480 Other specific joint derangements of unspecified joint, not elsewhere classified: Secondary | ICD-10-CM | POA: Diagnosis not present

## 2024-03-06 DIAGNOSIS — M2241 Chondromalacia patellae, right knee: Secondary | ICD-10-CM | POA: Insufficient documentation

## 2024-03-06 DIAGNOSIS — M6751 Plica syndrome, right knee: Secondary | ICD-10-CM | POA: Diagnosis not present

## 2024-03-06 NOTE — Progress Notes (Signed)
 Office Visit Note  Patient: Lori Wolf             Date of Birth: [birth year]           MRN: [id number]             PCP: Roanna Ezekiel NOVAK, MD Referring: Cecilia Kevin MATSU, NP Visit Date: 03/06/2024 Occupation: Data Unavailable  Subjective:  No chief complaint on file.   History of Present Illness: Lori Wolf is a 23 y.o. female ***     Activities of Daily Living:  Patient reports morning stiffness for 20-30 minutes.   Patient Reports nocturnal pain.  Difficulty dressing/grooming: Reports Difficulty climbing stairs: Reports Difficulty getting out of chair: Denies Difficulty using hands for taps, buttons, cutlery, and/or writing: Reports  Review of Systems  Constitutional:  Positive for fatigue.  HENT:  Negative for mouth sores and mouth dryness.   Eyes:  Negative for dryness.  Respiratory:  Negative for shortness of breath.   Cardiovascular:  Negative for chest pain and palpitations.  Gastrointestinal:  Positive for constipation. Negative for blood in stool and diarrhea.  Endocrine: Negative for increased urination.  Genitourinary:  Negative for involuntary urination.  Musculoskeletal:  Positive for joint pain, gait problem, joint pain, joint swelling, myalgias, muscle weakness, morning stiffness, muscle tenderness and myalgias.  Skin:  Positive for sensitivity to sunlight. Negative for color change, rash and hair loss.  Allergic/Immunologic: Negative for susceptible to infections.  Neurological:  Positive for dizziness and headaches.  Hematological:  Negative for swollen glands.  Psychiatric/Behavioral:  Positive for depressed mood. Negative for sleep disturbance. The patient is not nervous/anxious.     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   She attends [employer].   She lives with her aunt and uncle.   She has one brother.   Caffeine-few times a month      There is no immunization history on file for this patient.   Objective: Vital Signs: There were no vitals taken for this visit.   Physical Exam   Musculoskeletal Exam: ***  CDAI Exam: CDAI Score: -- Patient Global: --; Provider Global: -- Swollen: --; Tender: -- Joint Exam 03/06/2024   No joint exam has been documented for this visit   There is currently no information documented on the homunculus. Go to the Rheumatology activity and complete the homunculus joint exam.  Investigation: No additional findings.  Imaging: No results found.  Recent Labs: Lab Results  Component Value  Date   WBC 9.5 02/25/2020   HGB 13.3 02/25/2020   PLT 308 02/25/2020   NA 137 02/25/2020   K 3.8 02/25/2020   CL 102 02/25/2020   CO2 26 02/25/2020   GLUCOSE 102 (H) 02/25/2020   BUN 12 02/25/2020   CREATININE 1.09 (H) 02/25/2020   BILITOT 0.7 02/25/2020   ALKPHOS 66 02/25/2020   AST 22 02/25/2020   ALT 15 02/25/2020   PROT 7.9 02/25/2020   ALBUMIN 4.3 02/25/2020   CALCIUM 9.3 02/25/2020    Speciality Comments: No specialty comments available.  Procedures:  No procedures performed Allergies:  Peanut allergen powder-dnfp, Peanut butter flavoring agent (non-screening), and Peanut-containing drug products   Assessment / Plan:     Visit Diagnoses: No diagnosis found.  Orders: No orders of the defined types were placed in this encounter.  No orders of the defined types were placed in this encounter.   Face-to-face time spent with patient was *** minutes. Greater than 50% of time was spent in counseling and coordination of care.  Follow-Up Instructions: No follow-ups on file.   Kierra M VanEaton, CMA  Note - This record has been created using Animal nutritionist.  Chart creation errors have been sought, but may not always  have been located. Such creation errors do not reflect on  the standard of medical care.

## 2024-03-29 ENCOUNTER — Other Ambulatory Visit: Admitting: Psychology

## 2024-04-02 ENCOUNTER — Ambulatory Visit: Admitting: Family Medicine

## 2024-04-03 ENCOUNTER — Other Ambulatory Visit: Admitting: Psychology

## 2024-04-05 ENCOUNTER — Ambulatory Visit: Admitting: Psychology

## 2024-04-16 ENCOUNTER — Encounter: Payer: Self-pay | Admitting: Family Medicine

## 2024-04-16 ENCOUNTER — Ambulatory Visit (INDEPENDENT_AMBULATORY_CARE_PROVIDER_SITE_OTHER): Admitting: Family Medicine

## 2024-04-16 VITALS — BP 102/74 | HR 114 | Ht 64.0 in | Wt 163.0 lb

## 2024-04-16 DIAGNOSIS — Q7962 Hypermobile Ehlers-Danlos syndrome: Secondary | ICD-10-CM

## 2024-04-16 DIAGNOSIS — R790 Abnormal level of blood mineral: Secondary | ICD-10-CM

## 2024-04-16 NOTE — Patient Instructions (Addendum)
 Thank you for coming in today.   Send us  a message with your most recent labs, we can check iron stores and B12 levels if needed.   A referral for physical therapy has been submitted. A representative from the physical therapy office will contact you to coordinate scheduling after confirming your benefits with your insurance provider. If you do not hear from the physical therapy office within the next 1-2 weeks, please let us  know.  Check out the book Disjointed Navigating the Diagnosis and Management of Hypermobile Ehlers-Danlos Syndrome and Hypermobility Spectrum Disorders.    For POTS symptoms: Consume 3 L water and 8-12 gram of salt per day   Check out these websites for exercises to try if you have POTS ([employer] Protocol, [place] Protocol, and [employer] Protocol) [email or web address].taylor-robbins.[email or web address]/ https://dean.info/   Guide to Prof. Consuelo Patient Self-Care Handouts https://webspace.Http://www.black-smith.org/  Orthostatic Intolerance and Postural Orthostatic Tachycardia Self-Care Checklist https://webspace.Wvlyxc.com  Steps To Managing Your Mast Cell Activation Syndrome https://[email or web address].Newyearsms.fi.pdf   See you back in 2 months.

## 2024-04-16 NOTE — Progress Notes (Signed)
"       ° °  I, Lori Wolf, CMA acting as a scribe for Artist Lloyd, MD.  Lori Wolf is a 23 y.o. female who presents to [facility] today for evaluation of her hypermobility.  MS: Chronic joint pain, Chronic wide spread muscle pain, Joint Hypermobility, Joint Instability, Joint Subluxation, Abnormal spinal curvature, and Slipping Ribs / Costochondritis  Skin/Immune reactions: Hyperextensible/ stretchy Skin, Soft Velvety Skin, Poor Wound Healing, and Medication / Chemical / Food Sensitivities  Neurological: Headache  Migraine, Reduced Proprioception, Clumsiness , Burning, Numbness and/or Tingling in Extremities, Cognitive Dysfunction / Brain Fog, Sleep Disturbance, and Sensory Sensitivities ANS: Fatigue, Exercise / Temperature Intolerance , and Anxiety / Panic Respiratory: Dyspnea and Chest Tightness CV: Tachycardia GI:  GERD, IBS with Diarrhea or Constipation, Gastroparesis, and Painful ABD Bloating Genitourinary: Pelvic Pain / Fullness / Pressure Hands & Feet: Long Slender Fingers / Toes and Piezogenic Papules  Treatments tried: former PT, allergy meds  Pertinent review of systems: No fevers or chills  Relevant historical information: Syrinx spinal cord.  Depression.  Low ferritin and low vitamin D   Exam:  BP 102/74   Pulse (!) 114   Ht 5' 4 (1.626 m)   Wt 163 lb (73.9 kg)   SpO2 98%   BMI 27.98 kg/m  General: Well Developed, well nourished, and in no acute distress.   MSK: Hypermobility evaluation is positive with a Beighton score of 6. Ehlers-Danlos evaluation is positive with a score of 5    Lab and Radiology Results No results found for this or any previous visit (from the past 72 hours). No results found.     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 Plan for physical therapy to help with musculoskeletal pain. Discussed POTS and mast cell activation syndrome. Patient think she had labs checked  somewhat recently.  She will let me know what they are and if not been checked recently we should be checking ferritin and B12 and D. Recheck in 2 months.  PDMP not reviewed this encounter. Orders Placed This Encounter  Procedures   Ambulatory referral to Physical Therapy    Referral Priority:   Routine    Referral Type:   Physical Medicine    Referral Reason:   Specialty Services Required    Requested Specialty:   Physical Therapy    Number of Visits Requested:   1   No orders of the defined types were placed in this encounter.    Discussed warning signs or symptoms. Please see discharge instructions. Patient expresses understanding.   The above documentation has been reviewed and is accurate and complete Artist Lloyd, M.D.   "

## 2024-04-17 NOTE — Telephone Encounter (Signed)
 Forwarding to Dr. Joane to confirm orders.   Orders pending.

## 2024-05-07 ENCOUNTER — Ambulatory Visit: Admitting: Physical Therapy

## 2024-05-22 ENCOUNTER — Encounter: Payer: Self-pay | Admitting: Family Medicine

## 2024-06-18 ENCOUNTER — Ambulatory Visit: Admitting: Family Medicine

## 2024-06-19 ENCOUNTER — Other Ambulatory Visit: Admitting: Psychology

## 2024-06-26 ENCOUNTER — Other Ambulatory Visit: Admitting: Psychology

## 2024-06-28 ENCOUNTER — Ambulatory Visit: Admitting: Psychology
